# Patient Record
Sex: Male | Born: 1950 | Race: White | Hispanic: No | Marital: Married | Smoking: Former smoker
Health system: Southern US, Community
[De-identification: ages and names within clinical notes are randomized; demographics above are authoritative.]

## PROBLEM LIST (undated history)

## (undated) DIAGNOSIS — M545 Low back pain, unspecified: Secondary | ICD-10-CM

## (undated) DIAGNOSIS — R519 Headache, unspecified: Secondary | ICD-10-CM

## (undated) DIAGNOSIS — E785 Hyperlipidemia, unspecified: Secondary | ICD-10-CM

## (undated) DIAGNOSIS — N419 Inflammatory disease of prostate, unspecified: Secondary | ICD-10-CM

## (undated) DIAGNOSIS — D649 Anemia, unspecified: Secondary | ICD-10-CM

## (undated) DIAGNOSIS — K5790 Diverticulosis of intestine, part unspecified, without perforation or abscess without bleeding: Secondary | ICD-10-CM

## (undated) HISTORY — PX: FOOT SURGERY: SHX648

## (undated) HISTORY — DX: Low back pain: M54.5

## (undated) HISTORY — DX: Low back pain, unspecified: M54.50

## (undated) HISTORY — DX: Inflammatory disease of prostate, unspecified: N41.9

## (undated) HISTORY — DX: Diverticulosis of intestine, part unspecified, without perforation or abscess without bleeding: K57.90

---
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
## 1994-05-22 HISTORY — PX: COLECTOMY: SHX59

## 2006-06-08 ENCOUNTER — Ambulatory Visit: Payer: Self-pay | Admitting: Unknown Physician Specialty

## 2007-07-05 ENCOUNTER — Ambulatory Visit: Payer: Self-pay | Admitting: Internal Medicine

## 2008-06-05 ENCOUNTER — Ambulatory Visit: Payer: Self-pay | Admitting: Internal Medicine

## 2008-09-08 ENCOUNTER — Ambulatory Visit: Payer: Self-pay | Admitting: Unknown Physician Specialty

## 2010-05-22 ENCOUNTER — Ambulatory Visit: Payer: Self-pay | Admitting: Internal Medicine

## 2010-05-23 ENCOUNTER — Emergency Department: Payer: Self-pay | Admitting: Emergency Medicine

## 2011-09-14 ENCOUNTER — Emergency Department: Payer: Self-pay | Admitting: Internal Medicine

## 2011-09-14 LAB — COMPREHENSIVE METABOLIC PANEL
Albumin: 4.2 g/dL (ref 3.4–5.0)
Alkaline Phosphatase: 103 U/L (ref 50–136)
BUN: 12 mg/dL (ref 7–18)
Bilirubin,Total: 0.5 mg/dL (ref 0.2–1.0)
Calcium, Total: 8.8 mg/dL (ref 8.5–10.1)
Chloride: 105 mmol/L (ref 98–107)
Creatinine: 0.81 mg/dL (ref 0.60–1.30)
EGFR (African American): >60
Glucose: 95 mg/dL (ref 65–99)
Osmolality: 277 (ref 275–301)
SGPT (ALT): 29 U/L
Sodium: 139 mmol/L (ref 136–145)
Total Protein: 7.4 g/dL (ref 6.4–8.2)

## 2011-09-14 LAB — CBC
HGB: 14.2 g/dL (ref 13.0–18.0)
MCH: 31.8 pg (ref 26.0–34.0)
MCHC: 33.2 g/dL (ref 32.0–36.0)
Platelet: 231 10*3/uL (ref 150–440)
RBC: 4.47 10*6/uL (ref 4.40–5.90)

## 2011-09-14 LAB — TROPONIN I: Troponin-I: <0.02 ng/mL

## 2013-07-20 HISTORY — PX: COLONOSCOPY: SHX174

## 2013-10-24 ENCOUNTER — Ambulatory Visit: Payer: Self-pay

## 2013-10-27 ENCOUNTER — Encounter: Payer: Self-pay | Admitting: General Surgery

## 2013-10-27 ENCOUNTER — Ambulatory Visit (INDEPENDENT_AMBULATORY_CARE_PROVIDER_SITE_OTHER): Payer: 59 | Admitting: General Surgery

## 2013-10-27 ENCOUNTER — Other Ambulatory Visit: Payer: Self-pay | Admitting: General Surgery

## 2013-10-27 VITALS — BP 130/72 | HR 70 | Resp 20 | Ht 70.0 in | Wt 174.0 lb

## 2013-10-27 DIAGNOSIS — K4090 Unilateral inguinal hernia, without obstruction or gangrene, not specified as recurrent: Secondary | ICD-10-CM | POA: Insufficient documentation

## 2013-10-27 NOTE — Patient Instructions (Addendum)
Patient to be scheduled for a left inguinal hernia repair. The patient advised he can use Aleve or Advil as needed for pain. The patient is aware to call back for any questions or concerns.  Open Hernia Repair Open hernia repair is surgery to fix a hernia. A hernia occurs when an internal organ or tissue pushes out through a weak spot in the abdominal wall muscles. Hernias commonly occur in the groin and around the navel. Most hernias tend to get worse over time. Surgery is often done to prevent the hernia from getting bigger, becoming uncomfortable, or becoming an emergency. Emergency surgery may be needed if abdominal contents get stuck in the opening (incarcerated hernia) or the blood supply gets cut off (strangulated hernia). In an open repair, a large cut (incision) is made in the abdomen to perform the surgery. LET [facility] CARE PROVIDER KNOW ABOUT:  Any allergies you have.  All medicines you are taking, including vitamins, herbs, eye drops, creams, and over-the-counter medicines.  Previous problems you or members of your family have had with the use of anesthetics.  Any blood disorders you have.  Previous surgeries you have had.  Medical conditions you have. RISKS AND COMPLICATIONS Generally, this is a safe procedure. However, as with any procedure, complications can occur. Possible complications include:  Infection.  Bleeding.  Nerve injury.  Chronic pain.  The hernia can come back.  Injury to the intestines. BEFORE THE PROCEDURE  Ask your health care provider about changing or stopping any regular medicines. Avoid taking aspirin or blood thinners as directed by your health care provider.  Do noteat or drink anything after midnight the night before surgery.  If you smoke, do not smoke for at least 2 weeks before your surgery.  Do not drink alcohol the day before your surgery.  Let your health care provider know if you develop a cold or any infection before your  surgery.  Arrange for someone to drive you home after the procedure or after your hospital stay. Also arrange for someone to help you with activities during recovery. PROCEDURE   Small monitors will be put on your body. They are used to check your heart, blood pressure, and oxygen level.   An IV access tube will be put into one of your veins. Medicine will be able to flow directly into your body through this IV tube.   You might be given a medicine to help you relax (sedative).   You will be given a medicine to make you sleep (general anesthetic). A breathing tube may be placed into your lungs during the procedure.  A cut (incision) is made over the hernia defect, and the contents are pushed back into the abdomen.  If the hernia is small, stitches may be used to bring the muscle edges back together.  Typically, a surgeon will place a mesh patch made of man-made material (synthetic) to cover the defect. The mesh is sewn to healthy muscle. This reduces the risk of the hernia coming back.  The tissue and skin over the hernia are then closed with stitches or staples.  If the hernia was large, a drain may be left in place to collect excess fluid where the hernia used to be.  Bandages (dressings) are used to cover the incision. AFTER THE PROCEDURE  You will be taken to a recovery area where your progress will be monitored.  If the hernia was small or in the groin (inguinal) region, you will likely be allowed to  go home once you are awake, stable, and taking fluids well.  If the hernia was large, you may have to wait for your bowel function to return. You may need to stay in the hospital for 2 3 days until you can eat and your pain is controlled. A drain may be left in place for 5 7 days. You will be taught how to care for the drain. Document Released: 11/01/2000 Document Revised: 02/26/2013 Document Reviewed: 12/18/2012 [employer] Patient Information 2014 [place], [place].  Patient is  [facility]/13. He will pre admit by phone on 10/31/13. Patient is aware of date instructions.

## 2013-10-27 NOTE — Progress Notes (Signed)
aPatient ID: Russell Snow, male   DOB: 1950/08/12, 63 y.o.   MRN: [id number]  Chief Complaint  Patient presents with  . Other    LLQ pain     HPI DRAGON THRUSH is a 63 y.o. male here today for an evaluation of lower left quadrant pain. Patient states this has been going on for about 3 weeks . He had Ct scan done at [facility] on 10/24/13. He denies any nausea or vomiting. He just discontinued a course of antibiotics on 10/23/13. The pain is described as a dull ache that has gradually gone to a hot searing stabbing pain in the left groin. The more severe pain started approximately 1 week after the pain started. The pain comes and goes. He noticed the pain mostly when sitting and bending over. The pain can last up to 1.5 hours. The pain radiates to the groin. No problems with the bowels at this time. The patient had previously undergone a partial colectomy for recurrent diverticulitis in 1996.  The patient had previously been identified with gallstones dating back to at least 2012. He denies any dietary intolerance or right upper quadrant pain.   HPI  Past Medical History  Diagnosis Date  . Diverticulosis   . Lumbago   . Prostatitis     Past Surgical History  Procedure Laterality Date  . Tonsillectomy  1957  . Colectomy  1996  . Foot surgery  1998?    tumor removed  . Colonoscopy  07/2013    Dr. Vira Agar    History reviewed. No pertinent family history.  Social History History  Substance Use Topics  . Smoking status: Former Smoker -- 1.00 packs/day for 8 years    Types: Cigarettes  . Smokeless tobacco: Never Used  . Alcohol Use: Yes    No Known Allergies  Current Outpatient Prescriptions  Medication Sig Dispense Refill  . finasteride (PROSCAR) 5 MG tablet Take 1 tablet by mouth daily.      Marland Kitchen MAGNESIUM PO Take 1 tablet by mouth daily.      Marland Kitchen omeprazole (PRILOSEC) 20 MG capsule Take 1 capsule by mouth daily.       No current facility-administered medications for this visit.     Review of Systems Review of Systems  Constitutional: Negative.   Respiratory: Negative.   Cardiovascular: Negative.   Gastrointestinal: Positive for abdominal pain. Negative for nausea, vomiting, diarrhea, constipation, blood in stool, abdominal distention, anal bleeding and rectal pain.    Blood pressure 130/72, pulse 70, resp. rate 20, height _0  (1.778 m), weight 174 lb (78.926 kg).  Physical Exam Physical Exam  Constitutional: He is oriented to person, place, and time. He appears well-developed and well-nourished.  Neck: Neck supple. No thyromegaly present.  Cardiovascular: Normal rate, regular rhythm, normal heart sounds and normal pulses.   No murmur heard. Pulmonary/Chest: Effort normal and breath sounds normal.  Abdominal: Soft. Normal appearance and bowel sounds are normal. A hernia is present. Hernia confirmed positive in the left inguinal area. Hernia confirmed negative in the right inguinal area.  Genitourinary:     Lymphadenopathy:    He has no cervical adenopathy.  Neurological: He is alert and oriented to person, place, and time.  Skin: Skin is warm and dry.    Data Reviewed CT scan dated October 24, 2013 was reviewed. Diverticulosis in the descending colon without evidence of inflammation is identified. There is an area of nondistended colon in the sigmoid. Radiologist read concerned about the possibility of  an intraluminal lesion. (Patient reports colonoscopy this calendar year). Cholelithiasis. Left posterior diaphragmatic hernia. These fills were reviewed. Compared to previous study completed in 2012. Stable posterior diaphragmatic hernia without intestinal involvement. Gallstones noted 2012.  PCP notes from [facility] reviewed.  Laboratory studies stated October 23, 2013 showed a hemoglobin of 14.8 with an MCV of 92, white blood cell count 6600. Normal differential. Comprehensive metabolic panel entirely normal. Creatinine 0.9. EGFR 85.   Assessment     Symptomatic inguinal hernia.  Asymptomatic cholelithiasis.  Asymptomatic posterior diaphragmatic hernia.  No evidence of active diverticulitis.     Plan    The patient's report of dull pain progressing to a burning sensation is consistent with irritation of the iliohypogastric/ilioinguinal nerves. Clinical exam shows no evidence of obstruction. Clinical history does not suggestive obstructive symptoms. The CT does not clearly delineate bowel in the area although there is a prominent fat pad on the left inguinal canal.  Indications for elective repair with the use of prosthetic mesh were reviewed. Risks associated with surgery were discussed including those of bleeding, infection chronic pain.  Arrangements will be made to repair this at the next available time.     Patient is scheduled for [facility]. He will pre admit by phone on 10/31/13. Patient is aware of date instructions.  PCP: Lorn Junes Jen Benedict 10/27/2013, 8:48 PM

## 2013-11-11 ENCOUNTER — Ambulatory Visit: Payer: Self-pay | Admitting: General Surgery

## 2013-11-11 DIAGNOSIS — K4090 Unilateral inguinal hernia, without obstruction or gangrene, not specified as recurrent: Secondary | ICD-10-CM

## 2013-11-11 HISTORY — PX: HERNIA REPAIR: SHX51

## 2013-11-12 ENCOUNTER — Encounter: Payer: Self-pay | Admitting: General Surgery

## 2013-11-13 ENCOUNTER — Telehealth: Payer: Self-pay | Admitting: *Deleted

## 2013-11-13 NOTE — Telephone Encounter (Signed)
Phone call from patient states he has noticed some swelling/brusing of private area. No trouble voiding noted. Informed to monitor use heat/ice and wear support briefs/jockey strap. Call for worsening symptoms, pt agrees.

## 2013-11-20 ENCOUNTER — Encounter: Payer: Self-pay | Admitting: General Surgery

## 2013-11-20 ENCOUNTER — Ambulatory Visit (INDEPENDENT_AMBULATORY_CARE_PROVIDER_SITE_OTHER): Payer: Self-pay | Admitting: General Surgery

## 2013-11-20 VITALS — BP 130/78 | HR 78 | Resp 14 | Ht 70.0 in | Wt 178.0 lb

## 2013-11-20 DIAGNOSIS — K469 Unspecified abdominal hernia without obstruction or gangrene: Secondary | ICD-10-CM | POA: Insufficient documentation

## 2013-11-20 DIAGNOSIS — K4090 Unilateral inguinal hernia, without obstruction or gangrene, not specified as recurrent: Secondary | ICD-10-CM

## 2013-11-20 NOTE — Progress Notes (Signed)
Patient ID: Russell Snow, male   DOB: 1950/09/05, 63 y.o.   MRN: [id number]  Chief Complaint  Patient presents with  . Routine Post Op    HPI Russell MooreDanny C Schuitema is a 63 y.o. male.  Here today for postoperative visit for repair of left inguinal hernia preformed on 11/11/13. He reports the pain is very manageable and has stopped taking pain medications. He denies any other problems and is doing very well. He reports the discoloration and swelling in his genitals is mostly gone.  HPI  Past Medical History  Diagnosis Date  . Diverticulosis   . Lumbago   . Prostatitis     Past Surgical History  Procedure Laterality Date  . Tonsillectomy  1957  . Colectomy  1996  . Foot surgery  1998?    tumor removed  . Colonoscopy  07/2013    Dr. Mechele CollinElliott  . Hernia repair Left 11-11-13    inguinal    No family history on file.  Social History History  Substance Use Topics  . Smoking status: Former Smoker -- 1.00 packs/day for 8 years    Types: Cigarettes  . Smokeless tobacco: Never Used  . Alcohol Use: Yes    No Known Allergies  Current Outpatient Prescriptions  Medication Sig Dispense Refill  . finasteride (PROSCAR) 5 MG tablet Take 1 tablet by mouth daily.      Marland Kitchen. MAGNESIUM PO Take 1 tablet by mouth daily.      Marland Kitchen. omeprazole (PRILOSEC) 20 MG capsule Take 1 capsule by mouth daily.       No current facility-administered medications for this visit.    Review of Systems Review of Systems  Constitutional: Negative.   Respiratory: Negative.   Cardiovascular: Negative.     Blood pressure 130/78, pulse 78, resp. rate 14, height 5\' 10"  (1.778 m), weight 178 lb (80.74 kg).  Physical Exam Physical Exam  Constitutional: He is oriented to person, place, and time. He appears well-developed and well-nourished.  Genitourinary: Testes normal.  Mild swelling at surgical site.  Neurological: He is alert and oriented to person, place, and time.    Assessment    Doing well status post left  inguinal hernia repair.     Plan    The patient was encouraged to make use of local heat to help resolve the swelling more quickly. He may increase his activity as tolerated. Will plan for a followup examination in one month.      PCP;Clydie Braunavid Fitzgerald, MD   Earline MayotteByrnett, Jeffrey W 11/20/2013, 9:31 PM

## 2013-12-22 ENCOUNTER — Encounter: Payer: Self-pay | Admitting: General Surgery

## 2013-12-22 ENCOUNTER — Ambulatory Visit (INDEPENDENT_AMBULATORY_CARE_PROVIDER_SITE_OTHER): Payer: Self-pay | Admitting: General Surgery

## 2013-12-22 VITALS — BP 122/70 | HR 70 | Resp 12 | Ht 70.0 in | Wt 179.0 lb

## 2013-12-22 DIAGNOSIS — K4090 Unilateral inguinal hernia, without obstruction or gangrene, not specified as recurrent: Secondary | ICD-10-CM

## 2013-12-22 NOTE — Progress Notes (Signed)
Patient ID: Russell Snow, male   DOB: [birth year], 63 y.o.   MRN: [id number]  Chief Complaint  Patient presents with  . Routine Post Op    left inguinal hernia    HPI Russell Snow is a 63 y.o. male Here today for postoperative visit for repair of left inguinal hernia preformed on 11/11/13. He reports the pain is very manageable and has stopped taking pain medications. He has been aware of a small irritated area at the lateral aspect of the incision. HPI  Past Medical History  Diagnosis Date  . Diverticulosis   . Lumbago   . Prostatitis     Past Surgical History  Procedure Laterality Date  . Tonsillectomy  1957  . Colectomy  1996  . Foot surgery  1998?    tumor removed  . Colonoscopy  07/2013    Dr. Mechele CollinElliott  . Hernia repair Left 11-11-13    inguinal    No family history on file.  Social History History  Substance Use Topics  . Smoking status: Former Smoker -- 1.00 packs/day for 8 years    Types: Cigarettes  . Smokeless tobacco: Never Used  . Alcohol Use: Yes    No Known Allergies  Current Outpatient Prescriptions  Medication Sig Dispense Refill  . finasteride (PROSCAR) 5 MG tablet Take 1 tablet by mouth daily.      Marland Kitchen. omeprazole (PRILOSEC) 20 MG capsule Take 1 capsule by mouth daily.       No current facility-administered medications for this visit.    Review of Systems Review of Systems  Constitutional: Negative.   Respiratory: Negative.   Cardiovascular: Negative.     Blood pressure 122/70, pulse 70, resp. rate 12, height 5\' 10"  (1.778 m), weight 179 lb (81.194 kg).  Physical Exam Physical Exam  Constitutional: He is oriented to person, place, and time. He appears well-developed and well-nourished.  Abdominal:    Neurological: He is alert and oriented to person, place, and time.  Skin: Skin is warm and dry.       Assessment    Doing well status post left inguinal hernia repair.    Plan    The patient released to full activity. He'll call  if he has any concerns.    PCP: Russell Snow, Russell Snow    Russell Snow 12/23/2013, 6:56 PM

## 2013-12-22 NOTE — Patient Instructions (Signed)
Patient to return as needed. Proper lifting techniques reviewed. 

## 2014-09-12 NOTE — Op Note (Signed)
PATIENT NAME:  Russell Snow, Russell Snow MR#:  [id number] DATE OF BIRTH:  10/16/1950  DATE OF PROCEDURE:  11/11/2013  PREOPERATIVE DIAGNOSIS: Left inguinal hernia.   POSTOPERATIVE DIAGNOSIS: Left inguinal hernia, indirect.   OPERATIVE PROCEDURE: Left inguinal hernia repair with Bard plug and patch, polypropylene mesh.   SURGEON: Donnalee CurryJeffrey Byrnett, MD    ANESTHESIA: General by LMA, Marcaine 0.5% with 1:200,000 units of epinephrine, 30 mL local infiltration, Toradol 30 mg.   ESTIMATED BLOOD LOSS: Minimal.   CLINICAL NOTE: This 64 year old male has developed symptomatic inguinal hernia and was admitted for elective repair. He received Kefzol prior to the procedure.   Hair was removed with clippers in the holding area.   OPERATIVE NOTE: With the patient under adequate general anesthesia, the area was prepped with ChloraPrep and draped. Marcaine was infiltrated for postoperative analgesia. A 5 cm skin line incision along the anticipated course of the inguinal canal was carried down through the skin and subcutaneous tissue, with hemostasis achieved by electrocautery and 3-0 Vicryl ties. The external oblique was opened in the direction of its fibers. The cord and hernia sac were mobilized and encircled with a Penrose drain. Multiple lipomas of the cord were dissected back to the internal ring, ligated with 3-0 Vicryl, excised and discarded.   The hernia sac was dissected free and was noted to have marked scarring near the internal ring. This was reduced into the preperitoneal space, and a medium Bard polypropylene plug placed. This was anchored to the iliopubic tract with 0 Surgilon figure-of-eight suture. The onlay mesh was placed along the floor of the inguinal canal and anchored to the pubic tubercle with 0 Surgilon. The inferior aspect was anchored to the inguinal ligament with interrupted 0 Surgilon sutures, and the medial and superior edges and anchored to the transverse abdominis aponeurosis with similar  sutures. A lateral slit for cord passage was closed to minimize the chance for recurrent herniation.   The ilioinguinal, iliohypogastric, and genitofemoral nerves were identified and protected during the dissection. The external oblique was closed in the direction of its fibers, and Toradol placed in the wound for postoperative analgesia. The external oblique was closed with running 2-0 Vicryl, Scarpa's fascia was closed with running 3-0 Vicryl, and the skin closed with running 4-0 Vicryl subcuticular suture. Benzoin, Steri-Strips, Telfa and Tegaderm dressing was then applied. The patient tolerated the procedure well and was taken to the recovery room in stable condition.    ____________________________ Earline MayotteJeffrey W. Byrnett, MD jwb:cg D: 11/11/2013 22:43:02 ET T: 11/12/2013 02:21:45 ET JOB#: [id number]  cc: Earline MayotteJeffrey W. Byrnett, MD, <Dictator> Stann Mainlandavid P. Sampson GoonFitzgerald, MD JEFFREY Brion AlimentW BYRNETT MD ELECTRONICALLY SIGNED 11/12/2013 12:48

## 2015-11-15 ENCOUNTER — Other Ambulatory Visit: Payer: Self-pay | Admitting: Student

## 2015-11-15 DIAGNOSIS — M1711 Unilateral primary osteoarthritis, right knee: Secondary | ICD-10-CM

## 2015-11-15 DIAGNOSIS — R2241 Localized swelling, mass and lump, right lower limb: Secondary | ICD-10-CM

## 2015-12-03 ENCOUNTER — Ambulatory Visit
Admission: RE | Admit: 2015-12-03 | Discharge: 2015-12-03 | Disposition: A | Discharge status: Home / Self Care | Payer: Medicare Other | Source: Ambulatory Visit | Attending: Student | Admitting: Student

## 2015-12-03 DIAGNOSIS — M25861 Other specified joint disorders, right knee: Secondary | ICD-10-CM | POA: Diagnosis present

## 2015-12-03 DIAGNOSIS — S83231A Complex tear of medial meniscus, current injury, right knee, initial encounter: Secondary | ICD-10-CM | POA: Insufficient documentation

## 2015-12-03 DIAGNOSIS — M25461 Effusion, right knee: Secondary | ICD-10-CM | POA: Diagnosis not present

## 2015-12-03 DIAGNOSIS — R2241 Localized swelling, mass and lump, right lower limb: Secondary | ICD-10-CM

## 2015-12-03 DIAGNOSIS — S83271A Complex tear of lateral meniscus, current injury, right knee, initial encounter: Secondary | ICD-10-CM | POA: Insufficient documentation

## 2015-12-03 DIAGNOSIS — M1711 Unilateral primary osteoarthritis, right knee: Secondary | ICD-10-CM | POA: Insufficient documentation

## 2019-06-14 ENCOUNTER — Other Ambulatory Visit: Payer: Self-pay

## 2019-06-14 ENCOUNTER — Emergency Department
Admission: EM | Admit: 2019-06-14 | Discharge: 2019-06-14 | Disposition: A | Discharge status: Home / Self Care | Payer: Medicare Other | Attending: Student | Admitting: Student

## 2019-06-14 ENCOUNTER — Emergency Department: Payer: Medicare Other

## 2019-06-14 DIAGNOSIS — X501XXA Overexertion from prolonged static or awkward postures, initial encounter: Secondary | ICD-10-CM | POA: Insufficient documentation

## 2019-06-14 DIAGNOSIS — Z87891 Personal history of nicotine dependence: Secondary | ICD-10-CM | POA: Insufficient documentation

## 2019-06-14 DIAGNOSIS — S93601A Unspecified sprain of right foot, initial encounter: Secondary | ICD-10-CM | POA: Diagnosis not present

## 2019-06-14 DIAGNOSIS — Y929 Unspecified place or not applicable: Secondary | ICD-10-CM | POA: Diagnosis not present

## 2019-06-14 DIAGNOSIS — Y999 Unspecified external cause status: Secondary | ICD-10-CM | POA: Diagnosis not present

## 2019-06-14 DIAGNOSIS — S99921A Unspecified injury of right foot, initial encounter: Secondary | ICD-10-CM | POA: Diagnosis present

## 2019-06-14 DIAGNOSIS — Y939 Activity, unspecified: Secondary | ICD-10-CM | POA: Diagnosis not present

## 2019-06-14 MED ORDER — LIDOCAINE 5 % EX PTCH
1.0000 | MEDICATED_PATCH | CUTANEOUS | Status: DC
  Administered 2019-06-14: 1 via TRANSDERMAL
  Filled 2019-06-14: qty 1

## 2019-06-14 MED ORDER — LIDOCAINE 5 % EX PTCH: 1.0000 | MEDICATED_PATCH | Freq: Two times a day (BID) | CUTANEOUS | 0 refills | Status: AC

## 2019-06-14 NOTE — ED Triage Notes (Signed)
Pt states that at noon today he stepped out of his building and twisted right foot - pt reports hearing a "crack" and since is having severe pain on the outer aspect of right foot - pt reports difficulty with bearing weight

## 2019-06-14 NOTE — ED Notes (Addendum)
Pt c/o right lateral foot pain x1 week and states that this morning he stepped down and felt a "crack" in his right foot.

## 2019-06-14 NOTE — Discharge Instructions (Addendum)
Discharge care instruction apply Lidoderm patches as needed every 12 hours for pain.

## 2019-06-14 NOTE — ED Provider Notes (Signed)
[facility] Emergency Department Provider Note   ____________________________________________   First MD Initiated Contact with Patient 06/14/19 1441     (approximate)  I have reviewed the triage vital signs and the nursing notes.   HISTORY  Chief Complaint Foot Pain    HPI Russell Snow is a 69 y.o. male patient complain right foot pain secondary to a twisting movement while coming out of a to shed this  Afternoon.  Patient states he heard a "crack" and since then have pain with weightbearing.  Patient described the pain as "sharp".  The only palliative measures with nonweightbearing.         Past Medical History:  Diagnosis Date  . Diverticulosis   . Lumbago   . Prostatitis     Patient Active Problem List   Diagnosis Date Noted  . Hernia of abdominal cavity 11/20/2013  . Inguinal hernia 10/27/2013    Past Surgical History:  Procedure Laterality Date  . COLECTOMY  1996  . COLONOSCOPY  07/2013   Dr. Vira Agar  . FOOT SURGERY  1998?   tumor removed  . HERNIA REPAIR Left 11-11-13   inguinal  . TONSILLECTOMY  1957    Prior to Admission medications   Medication Sig Start Date End Date Taking? Authorizing Provider  finasteride (PROSCAR) 5 MG tablet Take 1 tablet by mouth daily.    [provider]  lidocaine (LIDODERM) 5 % Place 1 patch onto the skin every 12 (twelve) hours. Remove & Discard patch within 12 hours or as directed by MD 06/14/19 06/13/20  Sable Feil, PA-C  omeprazole (PRILOSEC) 20 MG capsule Take 1 capsule by mouth daily.    [provider]    Allergies Patient has no known allergies.  No family history on file.  Social History Social History   Tobacco Use  . Smoking status: Former Smoker    Packs/day: 1.00    Years: 8.00    Pack years: 8.00    Types: Cigarettes  . Smokeless tobacco: Never Used  Substance Use Topics  . Alcohol use: Yes    Alcohol/week: 1.0 standard drinks    Types: 1 Cans of  beer per week    Comment: daily  . Drug use: No    Review of Systems Constitutional: No fever/chills Eyes: No visual changes. ENT: No sore throat. Cardiovascular: Denies chest pain. Respiratory: Denies shortness of breath. Gastrointestinal: No abdominal pain.  No nausea, no vomiting.  No diarrhea.  No constipation. Genitourinary: Negative for dysuria. Musculoskeletal: Right foot pain. Skin: Negative for rash. Neurological: Negative for headaches, focal weakness or numbness.   ____________________________________________   PHYSICAL EXAM:  VITAL SIGNS: ED Triage Vitals  Enc Vitals Group     BP 06/14/19 1408 (!) 146/92     Pulse Rate 06/14/19 1408 84     Resp 06/14/19 1408 16     Temp 06/14/19 1408 99 F (37.2 C)     Temp Source 06/14/19 1408 Oral     SpO2 06/14/19 1408 97 %     Weight 06/14/19 1409 161 lb (73 kg)     Height 06/14/19 1409 5\' 8"  (1.727 m)     Head Circumference --      Peak Flow --      Pain Score 06/14/19 1409 0     Pain Loc --      Pain Edu? --      Excl. in [place]? --     Constitutional: Alert and oriented. Well  appearing and in no acute distress. Cardiovascular: Normal rate, regular rhythm. Grossly normal heart sounds.  Good peripheral circulation. Respiratory: Normal respiratory effort.  No retractions. Lungs CTAB. Musculoskeletal: No obvious deformity to the right foot.  Patient is moderate guarding palpation of the lateral aspect of the fourth and fifth metatarsal.   Neurologic:  Normal speech and language. No gross focal neurologic deficits are appreciated. No gait instability. Skin:  Skin is warm, dry and intact. No rash noted. Psychiatric: Mood and affect are normal. Speech and behavior are normal.  ____________________________________________   LABS (all labs ordered are listed, but only abnormal results are displayed)  Labs Reviewed - No data to display ____________________________________________  EKG    ____________________________________________  RADIOLOGY  ED MD interpretation:    Official radiology report(s): DG Foot Complete Right  Result Date: 06/14/2019 CLINICAL DATA:  Acute RIGHT foot pain after twisting incident today. EXAM: RIGHT FOOT COMPLETE - 3+ VIEW COMPARISON:  None. FINDINGS: No acute appearing fracture line or displaced fracture fragment identified. No acute or suspicious osseous lesion. Dystrophic chronic-appearing soft tissue calcification underlying the third metatarsal head, presumably sequela of previous injury or inflammation. No evidence of acute soft tissue abnormality. Mild degenerative spurring within the midfoot. No evidence of advanced degenerative change. IMPRESSION: No acute findings. Electronically Signed   By: Bary Richard M.D.   On: 06/14/2019 16:26    ____________________________________________   PROCEDURES  Procedure(s) performed (including Critical Care):  Procedures   ____________________________________________   INITIAL IMPRESSION / ASSESSMENT AND PLAN / ED COURSE  As part of my medical decision making, I reviewed the following data within the electronic MEDICAL RECORD NUMBER     Patient presents with right foot pain secondary to a twisting incident earlier today.  Discussed x-ray findings with patient showing no fracture dislocation.  Patient has moderate degenerative changes in the foot.  Lidoderm patch was applied to patient foot and he was Ace wrapped.  Patient given discharge care instructions advised follow-up PCP.    Russell Snow was evaluated in Emergency Department on 06/14/2019 for the symptoms described in the history of present illness. He was evaluated in the context of the global COVID-19 pandemic, which necessitated consideration that the patient might be at risk for infection with the SARS-CoV-2 virus that causes COVID-19. Institutional protocols and algorithms that pertain to the evaluation of patients at risk for COVID-19 are  in a state of rapid change based on information released by regulatory bodies including the CDC and federal and state organizations. These policies and algorithms were followed during the patient's care in the ED.       ____________________________________________   FINAL CLINICAL IMPRESSION(S) / ED DIAGNOSES  Final diagnoses:  Sprain of right foot, initial encounter     ED Discharge Orders         Ordered    lidocaine (LIDODERM) 5 %  Every 12 hours     06/14/19 1635           Note:  This document was prepared using Dragon voice recognition software and may include unintentional dictation errors.    Joni Reining, PA-C 06/14/19 1638    Miguel Aschoff., MD 06/15/19 Ebony Cargo

## 2020-02-04 ENCOUNTER — Other Ambulatory Visit: Payer: Self-pay

## 2020-02-04 ENCOUNTER — Other Ambulatory Visit
Admission: RE | Admit: 2020-02-04 | Discharge: 2020-02-04 | Disposition: A | Discharge status: Home / Self Care | Payer: Medicare Other | Source: Ambulatory Visit | Attending: General Surgery | Admitting: General Surgery

## 2020-02-04 DIAGNOSIS — Z20822 Contact with and (suspected) exposure to covid-19: Secondary | ICD-10-CM | POA: Insufficient documentation

## 2020-02-04 DIAGNOSIS — Z01812 Encounter for preprocedural laboratory examination: Secondary | ICD-10-CM | POA: Diagnosis present

## 2020-02-04 LAB — SARS CORONAVIRUS 2 (TAT 6-24 HRS): SARS Coronavirus 2: NEGATIVE

## 2020-02-05 ENCOUNTER — Encounter: Payer: Self-pay | Admitting: General Surgery

## 2020-02-06 ENCOUNTER — Encounter
Admission: RE | Disposition: A | Discharge status: Home / Self Care | Payer: Self-pay | Source: Home / Self Care | Attending: General Surgery

## 2020-02-06 ENCOUNTER — Other Ambulatory Visit: Payer: Self-pay

## 2020-02-06 ENCOUNTER — Ambulatory Visit
Admission: RE | Admit: 2020-02-06 | Discharge: 2020-02-06 | Disposition: A | Discharge status: Home / Self Care | Payer: Medicare Other | Attending: General Surgery | Admitting: General Surgery

## 2020-02-06 ENCOUNTER — Ambulatory Visit: Payer: Medicare Other | Admitting: Anesthesiology

## 2020-02-06 ENCOUNTER — Encounter: Payer: Self-pay | Admitting: General Surgery

## 2020-02-06 DIAGNOSIS — K5730 Diverticulosis of large intestine without perforation or abscess without bleeding: Secondary | ICD-10-CM | POA: Insufficient documentation

## 2020-02-06 DIAGNOSIS — Z9049 Acquired absence of other specified parts of digestive tract: Secondary | ICD-10-CM | POA: Insufficient documentation

## 2020-02-06 DIAGNOSIS — Z1211 Encounter for screening for malignant neoplasm of colon: Secondary | ICD-10-CM | POA: Insufficient documentation

## 2020-02-06 DIAGNOSIS — M353 Polymyalgia rheumatica: Secondary | ICD-10-CM | POA: Insufficient documentation

## 2020-02-06 DIAGNOSIS — K219 Gastro-esophageal reflux disease without esophagitis: Secondary | ICD-10-CM | POA: Insufficient documentation

## 2020-02-06 DIAGNOSIS — Z87891 Personal history of nicotine dependence: Secondary | ICD-10-CM | POA: Diagnosis not present

## 2020-02-06 DIAGNOSIS — Z8371 Family history of colonic polyps: Secondary | ICD-10-CM | POA: Insufficient documentation

## 2020-02-06 HISTORY — DX: Hyperlipidemia, unspecified: E78.5

## 2020-02-06 HISTORY — PX: COLONOSCOPY: SHX5424

## 2020-02-06 HISTORY — DX: Headache, unspecified: R51.9

## 2020-02-06 HISTORY — DX: Anemia, unspecified: D64.9

## 2020-02-06 SURGERY — COLONOSCOPY
Anesthesia: General

## 2020-02-06 MED ORDER — MIDAZOLAM HCL 5 MG/5ML IJ SOLN
INTRAMUSCULAR | Status: DC | PRN
  Administered 2020-02-06: 2 mg via INTRAVENOUS

## 2020-02-06 MED ORDER — LIDOCAINE HCL (PF) 2 % IJ SOLN
INTRAMUSCULAR | Status: DC | PRN
  Administered 2020-02-06: 60 mg

## 2020-02-06 MED ORDER — FENTANYL CITRATE (PF) 100 MCG/2ML IJ SOLN
INTRAMUSCULAR | Status: AC
  Filled 2020-02-06: qty 2

## 2020-02-06 MED ORDER — MIDAZOLAM HCL 2 MG/2ML IJ SOLN
INTRAMUSCULAR | Status: AC
  Filled 2020-02-06: qty 2

## 2020-02-06 MED ORDER — FENTANYL CITRATE (PF) 100 MCG/2ML IJ SOLN
INTRAMUSCULAR | Status: DC | PRN
  Administered 2020-02-06: 25 ug via INTRAVENOUS

## 2020-02-06 MED ORDER — PROPOFOL 10 MG/ML IV BOLUS
INTRAVENOUS | Status: DC | PRN
  Administered 2020-02-06: 20 mg via INTRAVENOUS

## 2020-02-06 MED ORDER — PROPOFOL 500 MG/50ML IV EMUL
INTRAVENOUS | Status: DC | PRN
  Administered 2020-02-06: 50 ug/kg/min via INTRAVENOUS

## 2020-02-06 MED ORDER — LIDOCAINE HCL (PF) 2 % IJ SOLN
INTRAMUSCULAR | Status: AC
  Filled 2020-02-06: qty 5

## 2020-02-06 MED ORDER — SODIUM CHLORIDE 0.9 % IV SOLN
INTRAVENOUS | Status: DC
  Administered 2020-02-06: 20 mL/h via INTRAVENOUS

## 2020-02-06 MED ORDER — PROPOFOL 500 MG/50ML IV EMUL
INTRAVENOUS | Status: AC
  Filled 2020-02-06: qty 50

## 2020-02-06 NOTE — Anesthesia Preprocedure Evaluation (Signed)
Anesthesia Evaluation  Patient identified by MRN, date of birth, ID band Patient awake    Reviewed: Allergy & Precautions, H&P , NPO status , Patient's Chart, lab work & pertinent test results  History of Anesthesia Complications Negative for: history of anesthetic complications  Airway Mallampati: III      Comment: TM 3 FB Dental  (+) Teeth Intact   Pulmonary neg sleep apnea, neg COPD, former smoker,    breath sounds clear to auscultation       Cardiovascular (-) angina(-) Past MI and (-) Cardiac Stents negative cardio ROS  (-) dysrhythmias  Rhythm:regular Rate:Normal     Neuro/Psych  Headaches, negative psych ROS   GI/Hepatic negative GI ROS, Neg liver ROS,   Endo/Other  negative endocrine ROS  Renal/GU negative Renal ROS  negative genitourinary   Musculoskeletal   Abdominal   Peds  Hematology negative hematology ROS (+)   Anesthesia Other Findings Past Medical History: No date: Anemia     Comment:  iron deficiency No date: Diverticulosis No date: Headache No date: Hyperlipidemia No date: Lumbago No date: Prostatitis  Past Surgical History: 1996: COLECTOMY 07/2013: COLONOSCOPY     Comment:  Dr. Mechele Collin 1998?: FOOT SURGERY     Comment:  tumor removed 11-11-13: HERNIA REPAIR; Left     Comment:  inguinal 1957: TONSILLECTOMY  BMI    Body Mass Index: 24.33 kg/m      Reproductive/Obstetrics negative OB ROS                             Anesthesia Physical Anesthesia Plan  ASA: II  Anesthesia Plan: General   Post-op Pain Management:    Induction:   PONV Risk Score and Plan: Propofol infusion and TIVA  Airway Management Planned: Nasal Cannula  Additional Equipment:   Intra-op Plan:   Post-operative Plan:   Informed Consent: I have reviewed the patients History and Physical, chart, labs and discussed the procedure including the risks, benefits and alternatives for  the proposed anesthesia with the patient or authorized representative who has indicated his/her understanding and acceptance.     Dental Advisory Given  Plan Discussed with: Anesthesiologist, CRNA and Surgeon  Anesthesia Plan Comments:         Anesthesia Quick Evaluation

## 2020-02-06 NOTE — Transfer of Care (Signed)
Immediate Anesthesia Transfer of Care Note  Patient: Russell Snow  Procedure(s) Performed: COLONOSCOPY (N/A )  Patient Location: PACU  Anesthesia Type:General  Level of Consciousness: sedated  Airway & Oxygen Therapy: Patient Spontanous Breathing and Patient connected to nasal cannula oxygen  Post-op Assessment: Report given to RN and Post -op Vital signs reviewed and stable  Post vital signs: Reviewed and stable  Last Vitals:  Vitals Value Taken Time  BP 95/59 02/06/20 1046  Temp 36.7 C 02/06/20 1046  Pulse 58 02/06/20 1047  Resp 17 02/06/20 1047  SpO2 100 % 02/06/20 1047  Vitals shown include unvalidated device data.  Last Pain:  Vitals:   02/06/20 1046  TempSrc: Temporal  PainSc:          Complications: No complications documented.

## 2020-02-06 NOTE — Anesthesia Postprocedure Evaluation (Signed)
Anesthesia Post Note  Patient: Russell Snow  Procedure(s) Performed: COLONOSCOPY (N/A )  Patient location during evaluation: PACU Anesthesia Type: General Level of consciousness: awake and alert Pain management: pain level controlled Vital Signs Assessment: post-procedure vital signs reviewed and stable Respiratory status: spontaneous breathing, nonlabored ventilation and respiratory function stable Cardiovascular status: blood pressure returned to baseline and stable Postop Assessment: no apparent nausea or vomiting Anesthetic complications: no   No complications documented.   Last Vitals:  Vitals:   02/06/20 1110 02/06/20 1120  BP: 103/68 105/63  Pulse: (!) 55 (!) 51  Resp: 18 17  Temp:    SpO2: 100% 98%    Last Pain:  Vitals:   02/06/20 1050  TempSrc: Temporal  PainSc:                  Karleen Hampshire

## 2020-02-06 NOTE — Op Note (Signed)
[facility] Patient Name: Russell Snow Procedure Date: 02/06/2020 10:24 AM MRN: [id number] Account #: [id number] Date of Birth: Aug 21, 1950 Admit Type: Outpatient Age: 69 Room: [facility] ENDO ROOM 1 Gender: Male Note Status: Finalized Procedure:             Colonoscopy Indications:           Family history of advanced adenoma of the colon in a                         first-degree relative before age 23 years Providers:             Earline Mayotte, MD Referring MD:          Clydie Braun (Referring MD) Medicines:             Monitored Anesthesia Care Complications:         No immediate complications. Procedure:             Pre-Anesthesia Assessment:                        - Prior to the procedure, a History and Physical was                         performed, and patient medications, allergies and                         sensitivities were reviewed. The patient's tolerance                         of previous anesthesia was reviewed.                        - The risks and benefits of the procedure and the                         sedation options and risks were discussed with the                         patient. All questions were answered and informed                         consent was obtained.                        After obtaining informed consent, the colonoscope was                         passed under direct vision. Throughout the procedure,                         the patient's blood pressure, pulse, and oxygen                         saturations were monitored continuously. The                         Colonoscope was introduced through the anus and                         advanced  to the the cecum, identified by appendiceal                         orifice and ileocecal valve. The colonoscopy was                         performed without difficulty. The patient tolerated                         the procedure well. The quality of the bowel                          preparation was excellent. Findings:      Multiple large-mouthed diverticula were found in the sigmoid colon,       descending colon, transverse colon, ascending colon and cecum.      The retroflexed view of the distal rectum and anal verge was normal and       showed no anal or rectal abnormalities. Impression:            - Diverticulosis in the sigmoid colon, in the                         descending colon, in the transverse colon, in the                         ascending colon and in the cecum.                        - The distal rectum and anal verge are normal on                         retroflexion view.                        - No specimens collected. Recommendation:        - Repeat colonoscopy in 5 years for surveillance. Procedure Code(s):     --- Professional ---                        [id number], Colonoscopy, flexible; diagnostic, including                         collection of specimen(s) by brushing or washing, when                         performed (separate procedure) Diagnosis Code(s):     --- Professional ---                        Z83.71, Family history of colonic polyps                        K57.30, Diverticulosis of large intestine without                         perforation or abscess without bleeding CPT copyright 2019 American Medical Association. All rights reserved. The codes documented in this report are preliminary and upon coder review may  be revised to meet current compliance requirements. Earline Mayotte, MD 02/06/2020 10:43:56 AM  This report has been signed electronically. Number of Addenda: 0 Note Initiated On: 02/06/2020 10:24 AM Scope Withdrawal Time: 0 hours 6 minutes 7 seconds  Total Procedure Duration: 0 hours 9 minutes 40 seconds       [facility]

## 2020-02-06 NOTE — H&P (Signed)
See history below. No interval change.  Family history colon polyps, for screening exam.  Lungs: Clear.  Cardio: RR.  HEENT: NEG.   Tolerated prep well.   Plan: Colonoscopy.        Russell Snow [place], [place] - 11/13/2019 9:00 AM EDT Formatting of this note is different from the original. [facility] - Colon Cancer Prevention Visit   Patient name: Russell Snow Date of Birth: 21-Jun-1950  No chief complaint on file. Date of Service: 11/13/2019 PCP: Velna Ochs, MD Referring Provider: Kathline Magic, MD   GI Patient Profile   [name] with the following known gastrointestinal history:  Specialty Problems   Gastroenterology Problems  GERD without esophagitis     Clinical data: -- 08/26/13: CSY (indic: FHx CP - mother/brother) - Sigmoid and descending diverticulosis. Small internal hemorrhoids. Repeat in 08/2018.  GI medications: -- Current: None. -- Prior: Prilosec (no longer needed after losing 25 lbs).  Interval History   [name] presents to schedule a colonoscopy for the indication of:  1. Family history of polyps in the colon   Last colonoscopy and family history are summarized above. He notes his bowel movements have been slightly looser over the last several months, but not more frequen and not watery. He has been eating more oatmeal and cream of wheat over the last several months, and wonders if this may have contributed. No abdominal pain, watery stools, nocturnal stools, steatorrhea, mucous in stool, alternating constipation, unexplained weight loss, or anorexia. Also no upper GI concerns like dysphagia or GERD symptoms.  In preparation for procedure(s), patient's past medical history and problem list were reviewed as below. Specifically, no history of MI, CVA, DM, renal issues, arthroplasty, cardiac surgery, sleep apnea, and problems with sedation.  Medical History   Problem  List: Patient Active Problem List  Diagnosis   Lumbago   Migraine headache   History of diverticulosis   History of prostatitis   Seasonal allergies   Benign non-nodular prostatic hyperplasia without lower urinary tract symptoms   GERD without esophagitis   Primary osteoarthritis of right knee   Synovial cyst of knee, right   Degenerative tear of meniscus, right   Rheumatoid factor positive   Polymyalgia rheumatica syndrome (CMS-HCC)   Screening for osteoporosis   Arthralgia    Medications: Outpatient Encounter Medications as of 11/13/2019  Medication Sig Dispense Refill   cholecalciferol (VITAMIN D3) 1000 unit capsule Take 1,000 Units by mouth once daily   No facility-administered encounter medications on file as of 11/13/2019.   Allergies: No Known Allergies   Past Medical History: Past Medical History:  Diagnosis Date   Anemia  iron deficiency   Chicken pox   History of diverticulosis   History of prostatitis   Hyperlipidemia   Lumbago   Migraine headache   Seasonal allergies  chronic    Past Surgical History: Past Surgical History:  Procedure Laterality Date   COLONOSCOPY 12/31/2002, 09/08/2008  FHCP (Mother/Brother)   COLONOSCOPY 08/26/2013  FHCP (Mother/Brother) CBF 08/2018: Recall ltr mailed   EGD 06/08/2006   excision morton's neuroma   Partial colectomy secondary to diverticulosis 1996   TONSILLECTOMY    Family History: Family History  Problem Relation Age of Onset   COPD Mother   Myocardial Infarction (Heart attack) Mother   Arthritis Mother   Colon polyps Mother   Diabetes type II Father   High blood pressure (Hypertension) Sister   Diverticulosis Brother   Colon  polyps Brother   Fibromyalgia Sister    Social History: Social History   Socioeconomic History   Marital status: Married  Spouse name: Not on file   Number of children: Not on file   Years of education: Not on file   Highest  education level: Not on file  Occupational History   Occupation: Retired  Tobacco Use   Smoking status: Former Smoker  Quit date: 05/22/1976  Years since quitting: 43.5   Smokeless tobacco: Never Used  Vaping Use   Vaping Use: Never used  Substance and Sexual Activity   Alcohol use: Yes  Alcohol/week: 0.0 standard drinks  Comment: occasionally   Drug use: No   Sexual activity: Yes  Partners: Female  Other Topics Concern   Not on file  Social History Narrative   Not on file   Social Determinants of Health   Financial Resource Strain:   Difficulty of Paying Living Expenses:  Food Insecurity:   Worried About Charity fundraiser in the Last Year:   Arboriculturist in the Last Year:  Transportation Needs:   Film/video editor (Medical):   Lack of Transportation (Non-Medical):  Physical Activity:   Days of Exercise per Week:   Minutes of Exercise per Session:  Stress:   Feeling of Stress :  Social Connections:   Frequency of Communication with Friends and Family:   Frequency of Social Gatherings with Friends and Family:   Attends Religious Services:   Active Member of Clubs or Organizations:   Attends Music therapist:   Marital Status:    General Review of Systems   Negative unless otherwise indicated: General: fever; chills; weight gain; weight loss; fatigue; weakness; depression; anxiety. Head: migraines; headaches. Eyes: jaundice; dryness; vision changes. Nose: bleeding. Mouth/Throat: oral ulcers; lymphadenopathy; dry mouth; sore throat; hoarseness. Endocrine: heat intolerance; cold intolerance. Respiratory: cough; wheezing; shortness of breath. Cardiovascular: chest pain; palpitations. GI: see HPI Musculoskeletal: joint swelling; muscle/joint pain. Neurological: syncope; seizures; dizziness; numbness/tingling. Skin: rashes; itching. Hematological and Lymphatic: easy bruising; easy bleeding.  Physical Examination    There were no vitals filed for this visit. There is no height or weight on file to calculate BMI.   Wt Readings from Last 3 Encounters:  09/17/19 72.1 kg (159 lb)  09/04/19 73.1 kg (161 lb 3.2 oz)  07/21/19 73 kg (161 lb)   General Appearance: WDWN male in no distress, sitting comfortably on exam room bench  Head: Atraumatic. Normocephalic.  Eyes: Anicteric.  Neck: Symmetrical. No lymphadenopathy.  Mouth: Lips, mucosa, and tongue normal.  Lungs: Clear to auscultation bilaterally. No wheezes/crackles/rales.  Heart: Regular rate and rhythm. S1 and S2 normal. No murmur, rub or gallop.  Abdomen:  Rectal: Soft, non-distended. + bowel sounds in all four quadrants. Non-tender. No rebound/guarding. No masses. No organomegaly. Deferred.  Extremities: No edema. No changes of vascular disease.  Skin: No abdominal rashes or lesions noted.  Neurologic: Alert and oriented x 3. No focal neurologic deficits appreciated.   Review of Data   I have personally reviewed and interpreted the following data today, in addition to data in GI Patient Profile.  Provider notes: Velna Ochs, MD (PCP) Kathline Magic, MD (Referring)  Labs: Appointment on 08/28/2019  Component Date Value   Glucose 08/28/2019 99   Sodium 08/28/2019 140   Potassium 08/28/2019 4.1   Chloride 08/28/2019 107   Carbon Dioxide (CO2) 08/28/2019 28.4   Urea Nitrogen (BUN) 08/28/2019 16   Creatinine 08/28/2019 0.9  Glomerular Filtration Ra* 08/28/2019 83   Calcium 08/28/2019 9.2   AST 08/28/2019 15   ALT 08/28/2019 14   Alk Phos (alkaline Phosp* 08/28/2019 95   Albumin 08/28/2019 4.2   Bilirubin, Total 08/28/2019 0.7   Protein, Total 08/28/2019 6.4   A/G Ratio 08/28/2019 1.9   WBC (White Blood Cell Co* 08/28/2019 4.9   RBC (Red Blood Cell Coun* 08/28/2019 4.62*   Hemoglobin 08/28/2019 14.8   Hematocrit 08/28/2019 44.0   MCV (Mean Corpuscular Vo* 08/28/2019 95.2   MCH (Mean  Corpuscular He* 08/28/2019 32.0*   MCHC (Mean Corpuscular H* 08/28/2019 33.6   Platelet Count 08/28/2019 245   RDW-CV (Red Cell Distrib* 08/28/2019 13.4   MPV (Mean Platelet Volum* 08/28/2019 9.0*   Neutrophils 08/28/2019 1.97   Lymphocytes 08/28/2019 1.84   Monocytes 08/28/2019 0.58   Eosinophils 08/28/2019 0.45   Basophils 08/28/2019 0.02   Neutrophil % 08/28/2019 40.5   Lymphocyte % 08/28/2019 37.8   Monocyte % 08/28/2019 11.9   Eosinophil % 08/28/2019 9.2*   Basophil% 08/28/2019 0.4   Immature Granulocyte % 08/28/2019 0.2   Immature Granulocyte Cou* 08/28/2019 0.01   Cholesterol, Total 08/28/2019 194   Triglyceride 08/28/2019 73   HDL (High Density Lipopr* 08/28/2019 52.2   LDL Calculated 08/28/2019 127   VLDL Cholesterol 08/28/2019 15   Cholesterol/HDL Ratio 08/28/2019 3.7   Color 08/28/2019 Yellow   Clarity 08/28/2019 Clear   Specific Gravity 08/28/2019 >=1.030   pH, Urine 08/28/2019 6.0   Protein, Urinalysis 08/28/2019 Negative   Glucose, Urinalysis 08/28/2019 Negative   Ketones, Urinalysis 08/28/2019 Negative   Blood, Urinalysis 08/28/2019 Negative   Nitrite, Urinalysis 08/28/2019 Negative   Leukocyte Esterase, Urin* 08/28/2019 Negative   White Blood Cells, Urina* 08/28/2019 None Seen   Red Blood Cells, Urinaly* 08/28/2019 None Seen   Bacteria, Urinalysis 08/28/2019 None Seen   Squamous Epithelial Cell* 08/28/2019 None Seen   Imaging: See Patient Profile. Reviewed in [facility].  Procedures: GI procedures reviewed in Surgical History.  Assessment & Plan   Mr. Mcalexander is a 69 y.o. male seen today for:  1. Family history of polyps in the colon Due for high-risk screening colonoscopy. No CRC alarm symptoms. Suspect softer stools are due to increase in soluble fiber through increased oatmeal intake, but encouraged patient to notify GI if symptoms worsen or new symptoms arise. Otherwise, patient is an appropriate candidate  for elective sedated procedures based on my review of their past medical history.  - Ambulatory Referral to Colonoscopy  Orders   Procedure orders: The procedure, risks, and benefits were discussed in detail, including, but not limited to bleeding, infection, perforation, and difficulty with sedation. The patient voiced their understanding and all questions were answered.  Procedure: High-risk screening colonoscopy. Location: [facility] - prefers Dr. Bary Castilla who previously performed laparoscopic partial colectomy and hernia repair. Prep: Miralax/Sugar-free Gatorade. Clearances: None. Anticoagulation: None.  No orders of the defined types were placed in this encounter.  Requested Prescriptions   No prescriptions requested or ordered in this encounter   Follow-up   No follow-ups on file.  This visit consisted of 50 minutes of total care. This includes 30 minutes of face-to-face contact with Sharyn Lull C. McGhee, PA-C, at least 50% of which was spent in counseling and education regarding diagnosis, treatment options, medication management, risks and benefits of treatment. The additional time was spend reviewing available records. All questions were adequately answered to patient's satisfaction. Patient in agreement with plan of care.  I personally performed the service. The  above plan of care was discussed and agreed upon under supervisoratory agreement between myself and Dr. Robet Leu.  [facility]le [facility], [facility] [place], [place] [address]

## 2020-04-20 ENCOUNTER — Ambulatory Visit (INDEPENDENT_AMBULATORY_CARE_PROVIDER_SITE_OTHER): Payer: Medicare Other | Admitting: Podiatry

## 2020-04-20 ENCOUNTER — Other Ambulatory Visit: Payer: Self-pay

## 2020-04-20 DIAGNOSIS — M2041 Other hammer toe(s) (acquired), right foot: Secondary | ICD-10-CM

## 2020-04-20 DIAGNOSIS — L600 Ingrowing nail: Secondary | ICD-10-CM

## 2020-04-20 DIAGNOSIS — Z79899 Other long term (current) drug therapy: Secondary | ICD-10-CM | POA: Diagnosis not present

## 2020-04-20 DIAGNOSIS — B351 Tinea unguium: Secondary | ICD-10-CM

## 2020-04-20 DIAGNOSIS — M79674 Pain in right toe(s): Secondary | ICD-10-CM

## 2020-04-20 DIAGNOSIS — M21622 Bunionette of left foot: Secondary | ICD-10-CM

## 2020-04-20 DIAGNOSIS — M79675 Pain in left toe(s): Secondary | ICD-10-CM

## 2020-04-20 DIAGNOSIS — M21621 Bunionette of right foot: Secondary | ICD-10-CM

## 2020-04-20 MED ORDER — TERBINAFINE HCL 250 MG PO TABS: 250.0000 mg | ORAL_TABLET | Freq: Every day | ORAL | 0 refills | Status: DC

## 2020-04-20 MED ORDER — DOXYCYCLINE HYCLATE 100 MG PO TABS: 100.0000 mg | ORAL_TABLET | Freq: Two times a day (BID) | ORAL | 0 refills | Status: DC

## 2020-04-20 MED ORDER — GENTAMICIN SULFATE 0.1 % EX CREA: 1.0000 "application " | TOPICAL_CREAM | Freq: Two times a day (BID) | CUTANEOUS | 1 refills | Status: DC

## 2020-04-20 NOTE — Progress Notes (Signed)
   Subjective: Patient presents today for evaluation of pain to the medial lateral border of the bilateral great toes. Patient is concerned for possible ingrown nail.  Patient states that he is dealt with ingrown toenails for several years now.  Normally he tries to treat them himself.  Most recently he has had knee problems and cannot address the toenails by himself.  Patient presents today for further treatment and evaluation.  Past Medical History:  Diagnosis Date  . Anemia    iron deficiency  . Diverticulosis   . Headache   . Hyperlipidemia   . Lumbago   . Prostatitis     Objective:  General: Well developed, nourished, in no acute distress, alert and oriented x3   Dermatology: Skin is warm, dry and supple bilateral.  Medial lateral border bilateral great toes appears to be erythematous with evidence of an ingrowing nail. Pain on palpation noted to the border of the nail fold. There are no open sores, lesions.  Hyperkeratotic dystrophic thickened discolored nails noted specifically to the bilateral great toes   Vascular: Dorsalis Pedis artery and Posterior Tibial artery pedal pulses palpable. No lower extremity edema noted.   Neruologic: Grossly intact via light touch bilateral.  Musculoskeletal: Muscular strength within normal limits in all groups bilateral. Normal range of motion noted to all pedal and ankle joints.  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 Patient evaluated.  2. Discussed treatment alternatives and plan of care. Explained nail avulsion procedure and post procedure course to patient. 3. Patient opted for permanent partial nail avulsion of the medial lateral border  bilateral great toes.  4. Prior to procedure, local anesthesia infiltration utilized using 3 ml of a 50:50 mixture of 2% plain lidocaine and 0.5% plain marcaine in a normal hallux block fashion and a betadine prep performed.  5. Partial permanent nail avulsion with chemical matrixectomy performed using 3x30sec applications of phenol followed by alcohol flush.  6. Light dressing applied. 7.  Prescription for gentamicin cream applied daily  8.  Order placed for hepatic function panel  9.  Prescription for Lamisil 250 mg #90 daily.   10.  Return to clinic 3 weeks.  Felecia Shelling, DPM [facility]  Dr. Felecia Shelling, DPM    [address]                                        [place], [place] [address]                Office [phone]  Fax [phone]

## 2020-05-11 ENCOUNTER — Ambulatory Visit (INDEPENDENT_AMBULATORY_CARE_PROVIDER_SITE_OTHER): Payer: Medicare Other | Admitting: Podiatry

## 2020-05-11 ENCOUNTER — Other Ambulatory Visit: Payer: Self-pay

## 2020-05-11 ENCOUNTER — Ambulatory Visit (INDEPENDENT_AMBULATORY_CARE_PROVIDER_SITE_OTHER): Payer: Medicare Other

## 2020-05-11 ENCOUNTER — Encounter: Payer: Self-pay | Admitting: Podiatry

## 2020-05-11 DIAGNOSIS — M25871 Other specified joint disorders, right ankle and foot: Secondary | ICD-10-CM | POA: Diagnosis not present

## 2020-05-11 DIAGNOSIS — M2041 Other hammer toe(s) (acquired), right foot: Secondary | ICD-10-CM

## 2020-05-11 DIAGNOSIS — L600 Ingrowing nail: Secondary | ICD-10-CM | POA: Diagnosis not present

## 2020-05-11 MED ORDER — BETAMETHASONE SOD PHOS & ACET 6 (3-3) MG/ML IJ SUSP
3.0000 mg | Freq: Once | INTRAMUSCULAR | Status: AC
  Administered 2020-05-11: 3 mg via INTRAMUSCULAR

## 2020-05-11 NOTE — Progress Notes (Signed)
   Subjective: 69 y.o. male presents today status post permanent nail avulsion procedure of the medial and lateral border of the right great toe as well as the lateral border of the left great toe that was performed on 04/20/2020.  Patient states that he is doing well.  He took the antibiotics as prescribed. Patient does have a new complaint today regarding right great toe pain.  He failed to mention this last visit.  He states that the weightbearing surface of the right great toe has been symptomatic and painful for a while.  He cannot recall an incident that would have aggravated the foot.  He has not done anything for treatment.  He presents for further treatment and evaluation  Past Medical History:  Diagnosis Date  . Anemia    iron deficiency  . Diverticulosis   . Headache   . Hyperlipidemia   . Lumbago   . Prostatitis     Objective: Skin is warm, dry and supple. Nail and respective nail fold appears to be healing appropriately. Open wound to the associated nail fold with a granular wound base and moderate amount of fibrotic tissue. Minimal drainage noted. Mild erythema around the periungual region likely due to phenol chemical matricectomy.  There is some pain on palpation range of motion to the sesamoidal apparatus of the right foot  Radiographic exam Hallux valgus noted with an increased intermetatarsal angle right foot.  Subluxation/dislocation of the third MTPJ also noted to the right foot.  No fracture identified.  Normal osseous mineralization.  Assessment: #1 postop permanent partial nail avulsion medial lateral border bilateral great toes #2  Sesamoiditis right first MTPJ  Plan of care: #1 patient was evaluated  #2 debridement of open wound was performed to the periungual border of the respective toe using a currette. Antibiotic ointment and Band-Aid was applied. #3  Injection of 0.5 cc Celestone Soluspan injected into the first MTPJ right foot #4 recommend good  supportive shoes and sneakers #5 return to clinic as needed   Felecia Shelling, DPM [facility]  Dr. Felecia Shelling, DPM    [address] [place], [place] [address]                Office [phone]  Fax [phone]

## 2020-06-03 ENCOUNTER — Other Ambulatory Visit: Payer: Self-pay | Admitting: Podiatry

## 2020-06-03 DIAGNOSIS — M25871 Other specified joint disorders, right ankle and foot: Secondary | ICD-10-CM

## 2020-06-23 ENCOUNTER — Other Ambulatory Visit: Payer: Self-pay | Admitting: Internal Medicine

## 2020-06-23 DIAGNOSIS — R519 Headache, unspecified: Secondary | ICD-10-CM

## 2020-07-06 ENCOUNTER — Ambulatory Visit
Admission: RE | Admit: 2020-07-06 | Discharge: 2020-07-06 | Disposition: A | Discharge status: Home / Self Care | Payer: Medicare Other | Source: Ambulatory Visit | Attending: Internal Medicine | Admitting: Internal Medicine

## 2020-07-06 ENCOUNTER — Other Ambulatory Visit: Payer: Self-pay

## 2020-07-06 DIAGNOSIS — R519 Headache, unspecified: Secondary | ICD-10-CM | POA: Insufficient documentation

## 2020-07-29 ENCOUNTER — Other Ambulatory Visit: Payer: Self-pay | Admitting: Internal Medicine

## 2020-07-29 DIAGNOSIS — R591 Generalized enlarged lymph nodes: Secondary | ICD-10-CM

## 2020-08-13 ENCOUNTER — Ambulatory Visit
Admission: RE | Admit: 2020-08-13 | Discharge: 2020-08-13 | Disposition: A | Discharge status: Home / Self Care | Payer: Medicare Other | Source: Ambulatory Visit | Attending: Internal Medicine | Admitting: Internal Medicine

## 2020-08-13 ENCOUNTER — Other Ambulatory Visit: Payer: Self-pay

## 2020-08-13 DIAGNOSIS — R591 Generalized enlarged lymph nodes: Secondary | ICD-10-CM | POA: Insufficient documentation

## 2020-10-01 ENCOUNTER — Encounter: Payer: Self-pay | Admitting: Podiatry

## 2020-10-01 ENCOUNTER — Other Ambulatory Visit: Payer: Self-pay

## 2020-10-01 ENCOUNTER — Ambulatory Visit (INDEPENDENT_AMBULATORY_CARE_PROVIDER_SITE_OTHER): Payer: Medicare Other | Admitting: Podiatry

## 2020-10-01 DIAGNOSIS — B070 Plantar wart: Secondary | ICD-10-CM

## 2020-10-01 NOTE — Progress Notes (Signed)
   Subjective: 70 y.o. male presenting to the office today for evaluation of symptomatic skin lesions to the bilateral feet that developed over the past few months.  He has been using an Landscape architect to try to shave them down with minimal improvement.  He presents for further treatment and evaluation    Past Medical History:  Diagnosis Date  . Anemia    iron deficiency  . Diverticulosis   . Headache   . Hyperlipidemia   . Lumbago   . Prostatitis     Objective: Physical Exam General: The patient is alert and oriented x3 in no acute distress.   Dermatology: Hyperkeratotic skin lesions noted to the plantar aspect of the bilateral feet. Pinpoint bleeding noted upon debridement. Skin is warm, dry and supple bilateral lower extremities. Negative for open lesions or macerations.   Vascular: Palpable pedal pulses bilaterally. No edema or erythema noted. Capillary refill within normal limits.   Neurological: Epicritic and protective threshold grossly intact bilaterally.    Musculoskeletal Exam: Pain on palpation to the noted skin lesions.  Range of motion within normal limits to all pedal and ankle joints bilateral. Muscle strength 5/5 in all groups bilateral.    Assessment: 1. plantar wart bilateral feet    Plan of Care:  1. Patient was evaluated. 2. Excisional debridement of the plantar wart lesion(s) was performed using a chisel blade.  Salicylic acid was applied and the lesion(s) was dressed with a dry sterile dressing. 3.  Recommend OTC salicylic acid daily  4.  Continue emery board 5.  Return to clinic as needed  Felecia Shelling, DPM [facility]  Dr. Felecia Shelling, DPM    [address] [place], [place] [address]                Office [phone]  Fax [phone]

## 2022-05-24 IMAGING — CT CT HEAD W/O CM
4 series · 15 of 47 positions shown, 17 images · non-contrast
Comparison: None.

CLINICAL DATA: electric tooth brush he has a pain on left forehead
at times, he states he has reoccurring left forehead pain

EXAM:
CT HEAD WITHOUT CONTRAST
TECHNIQUE: Contiguous axial images were obtained from the base of the skull
through the vertex without intravenous contrast.

[Series 2: axial st head 5.00 ax · axial · 0.38mm/px · z∈[-536,-421]mm · 7 of 31 slices shown, 9 images]
[im 4/31  brain]
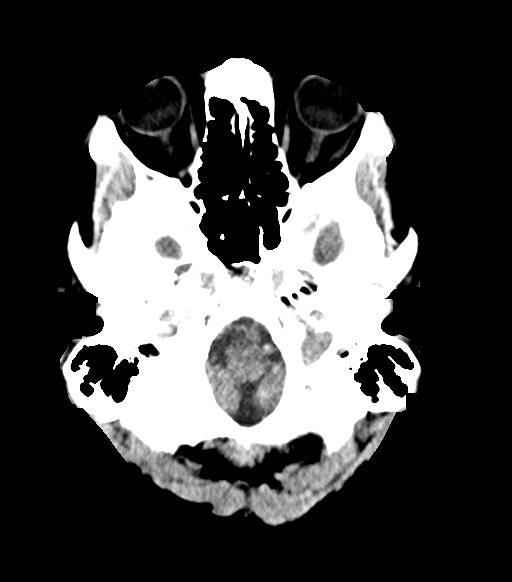
[im 4/31  bone]
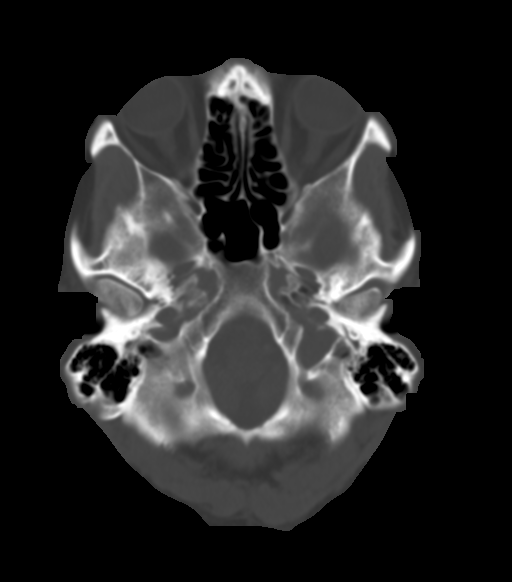
[im 8/31  brain]
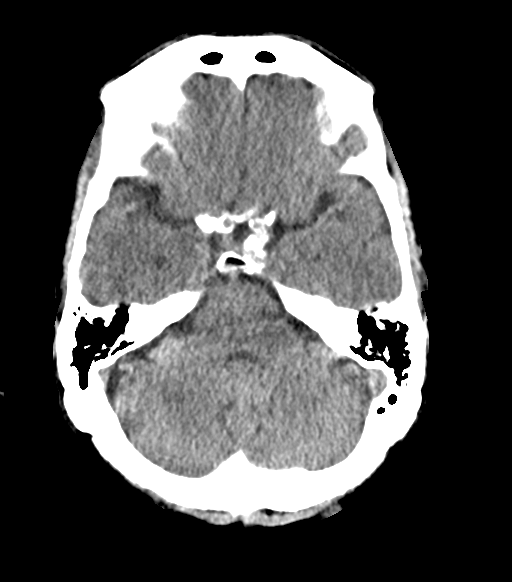
[im 12/31  brain]
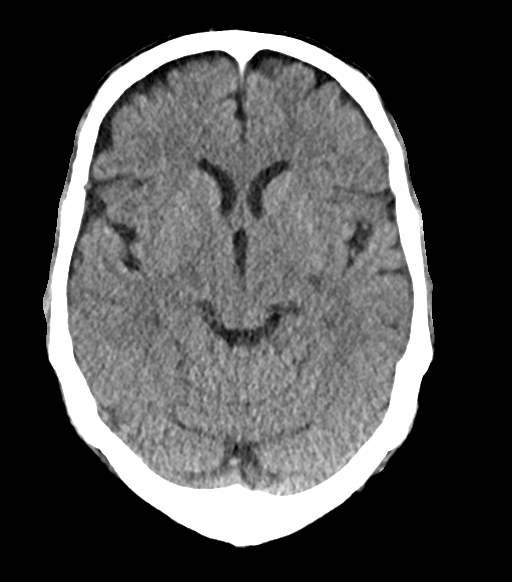
[im 16/31  brain]
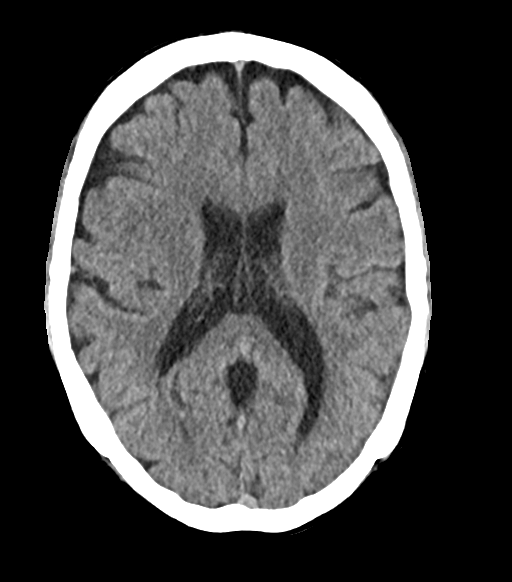
[im 19/31  brain]
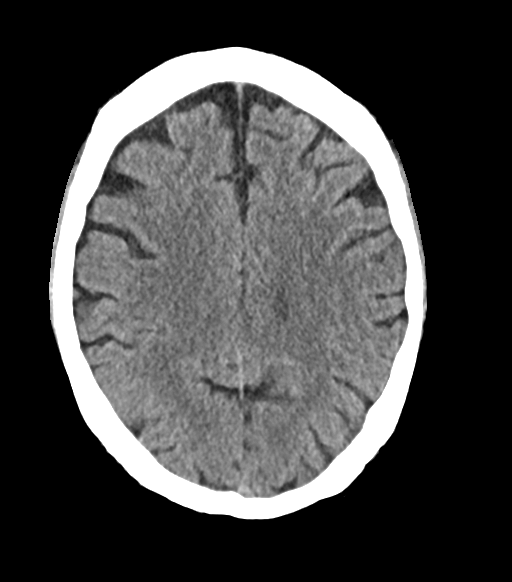
[im 19/31  bone]
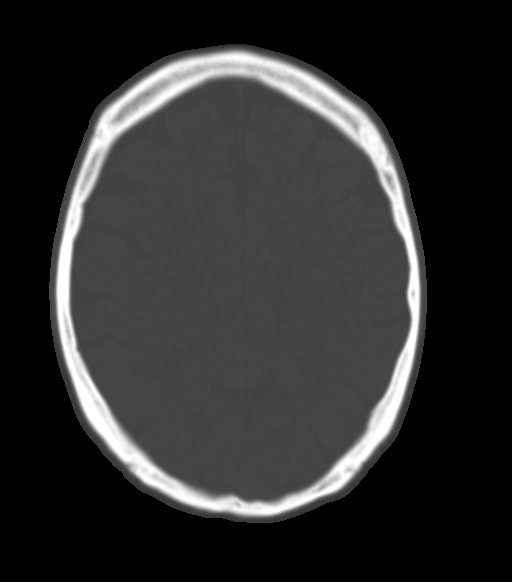
[im 23/31  brain]
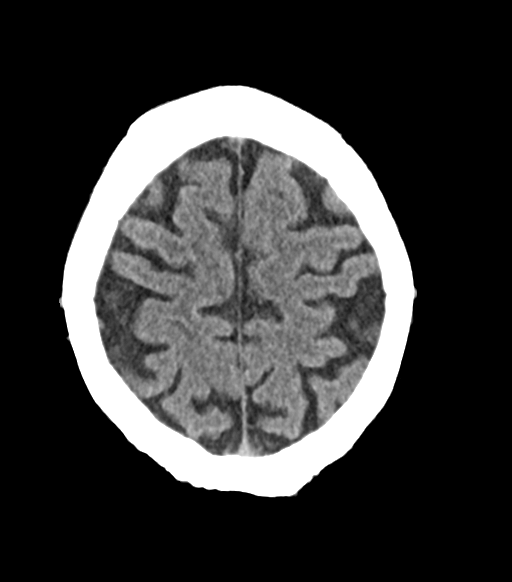
[im 27/31  brain]
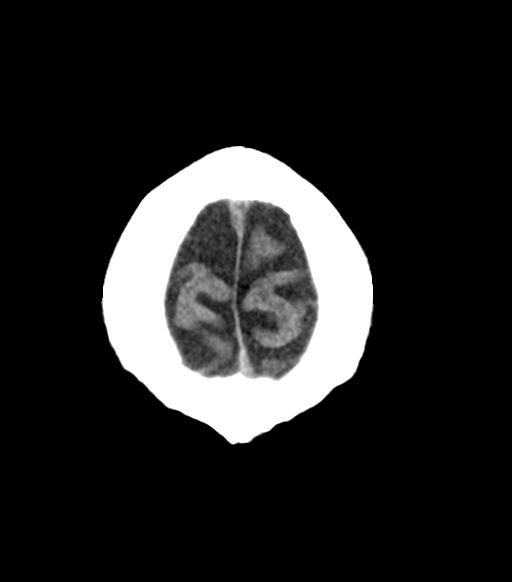

[Series 4: bone windows head 2.00 ax · axial · 0.38mm/px · z∈[-538,-523]mm · 2 of 78 slices shown]
[im 8/78  bone]
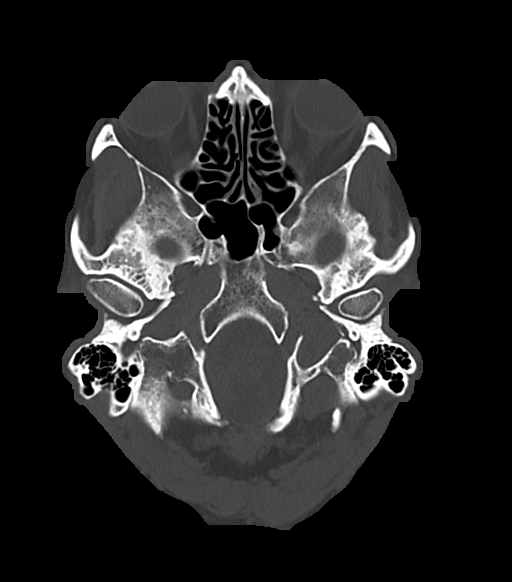
[im 16/78  bone]
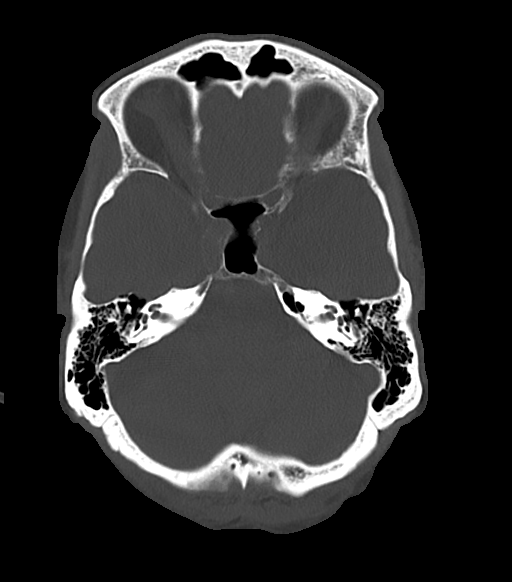

[Series 6: coronals head 3.00 cor · coronal · 0.31mm/px · 3 of 74 slices shown]
[im 25/74  brain]
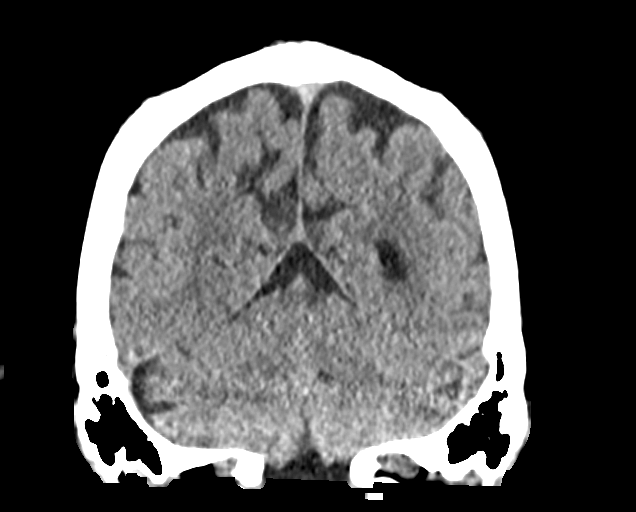
[im 33/74  brain]
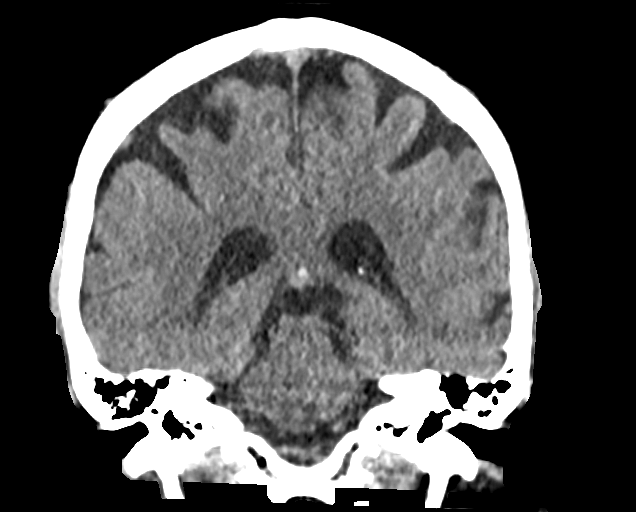
[im 41/74  brain]
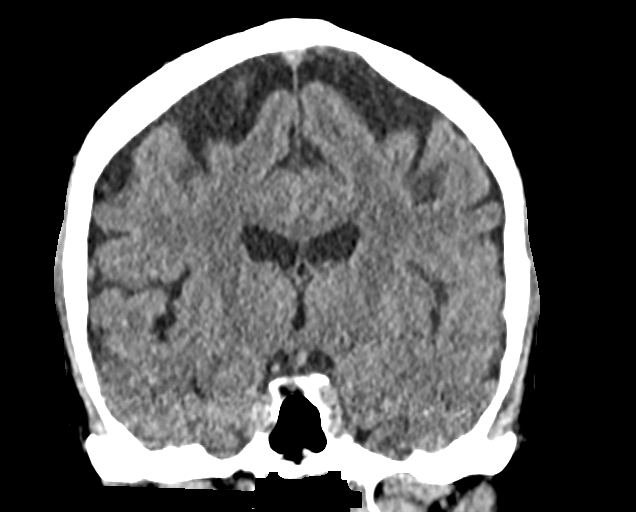

[Series 8: sagittals head 3.00 sag · sagittal · 0.31mm/px · 3 of 65 slices shown]
[im 22/65  brain]
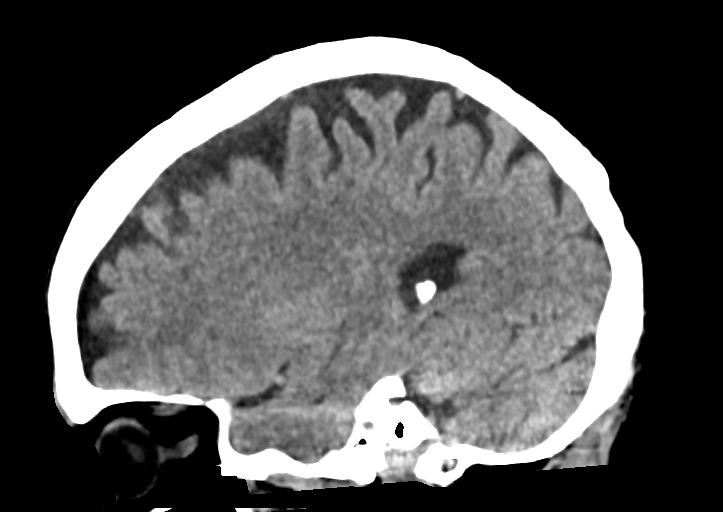
[im 33/65  brain]
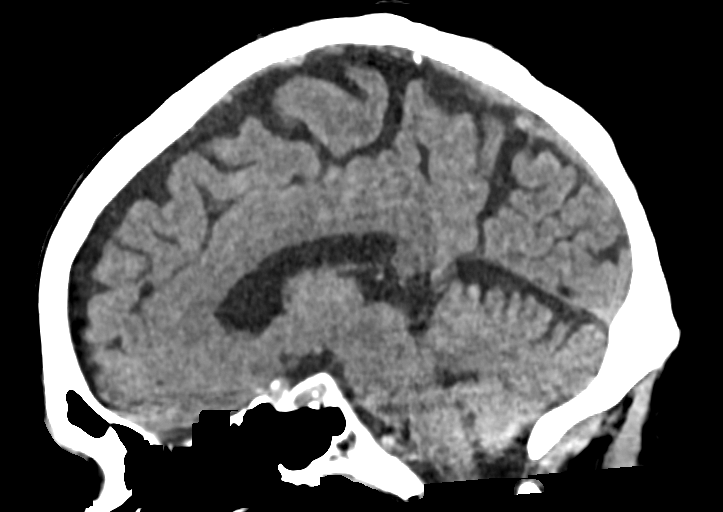
[im 43/65  brain]
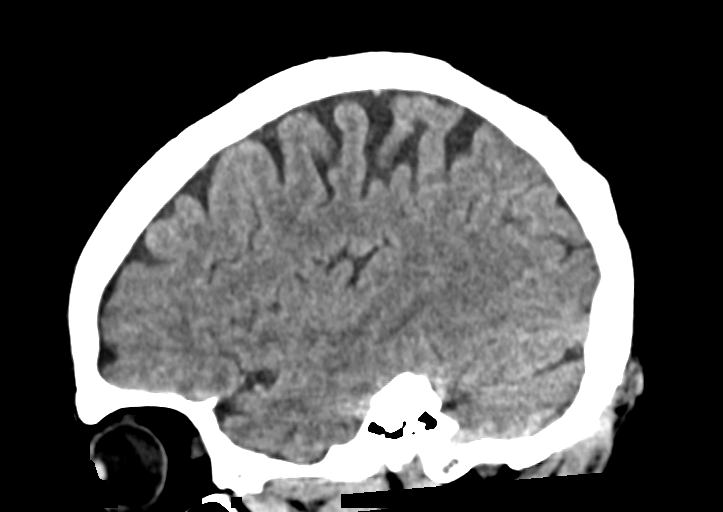

[15 of 47 positions shown; findings below may reference images not displayed]

FINDINGS: Brain:

No evidence of large-territorial acute infarction. No parenchymal
hemorrhage. No mass lesion. No extra-axial collection.

No mass effect or midline shift. No hydrocephalus. Basilar cisterns
are patent.

Vascular: No hyperdense vessel. Atherosclerotic calcifications are
present within the cavernous internal carotid and vertebral
arteries.

Skull: No acute fracture or focal lesion.

Sinuses/Orbits: Paranasal sinuses and mastoid air cells are clear.
The orbits are unremarkable.

Other: None.
IMPRESSION: No acute intracranial abnormality.

## 2022-07-01 IMAGING — CT CT NECK W/O CM
3 of 5 series · 12 of 33 positions shown, 14 images · non-contrast
Comparison: None.

CLINICAL DATA: Left submandibular swelling.

EXAM:
CT NECK WITHOUT CONTRAST
TECHNIQUE: Multidetector CT imaging of the neck was performed following the
standard protocol without intravenous contrast.

[Series 4: cor neck neck (person_name) 2.00 cor · coronal · 0.58mm/px · 3 of 122 slices shown]
[im 44/122  bone]
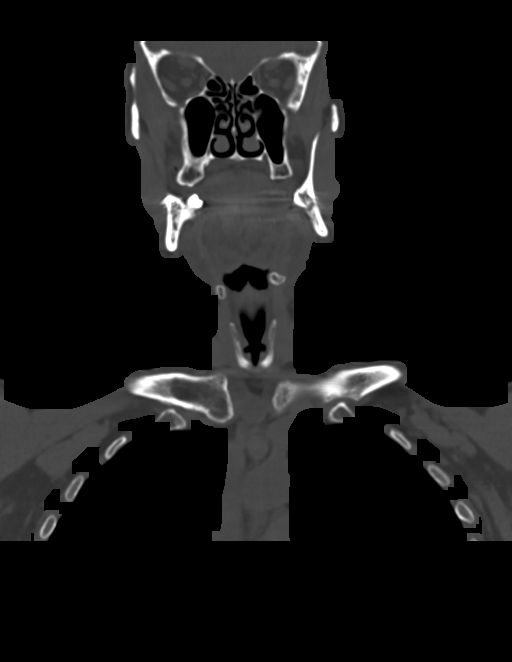
[im 55/122  bone]
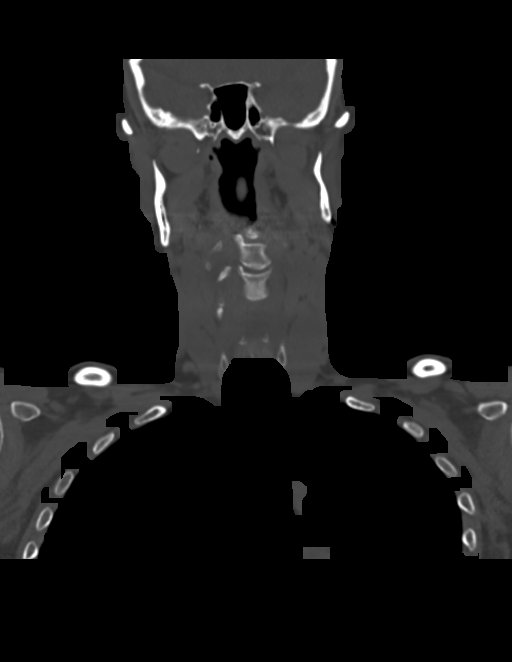
[im 67/122  bone]
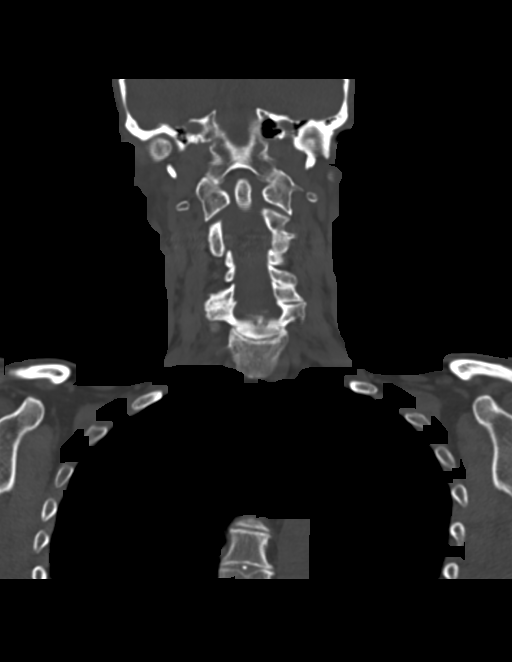

[Series 6: sag neck neck (person_name) 2.00 sag · sagittal · 0.48mm/px · 5 of 148 slices shown, 6 images]
[im 50/148  bone]
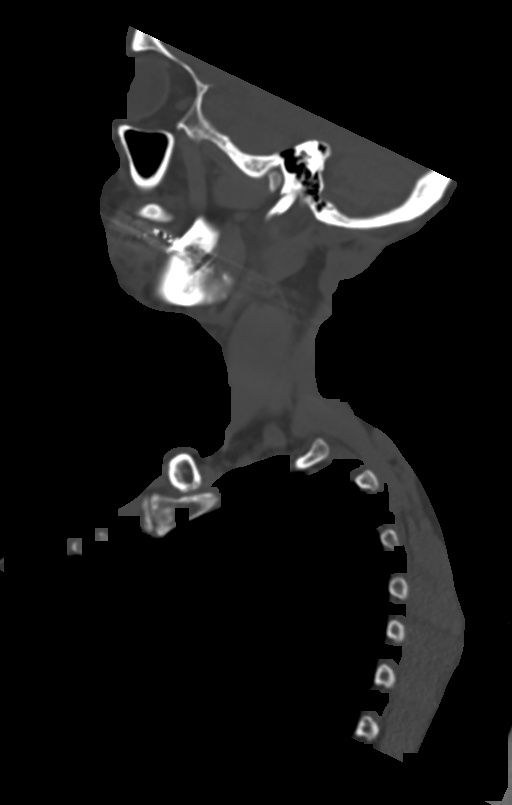
[im 62/148  bone]
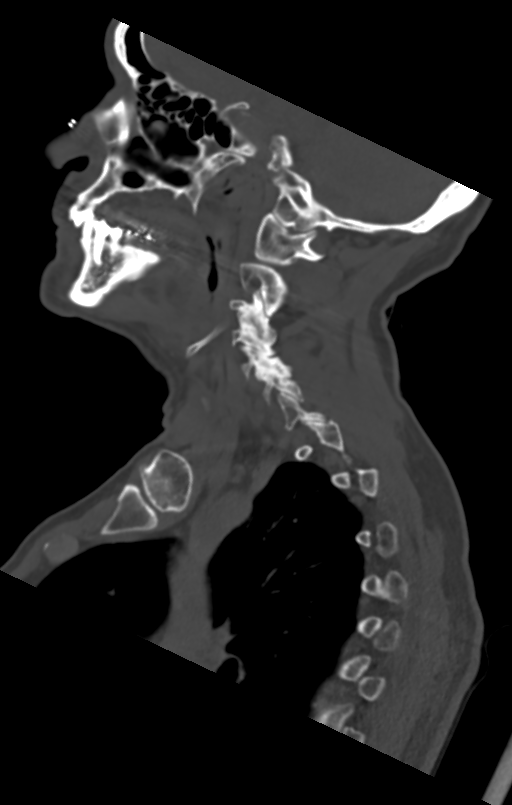
[im 74/148  soft-tissue]
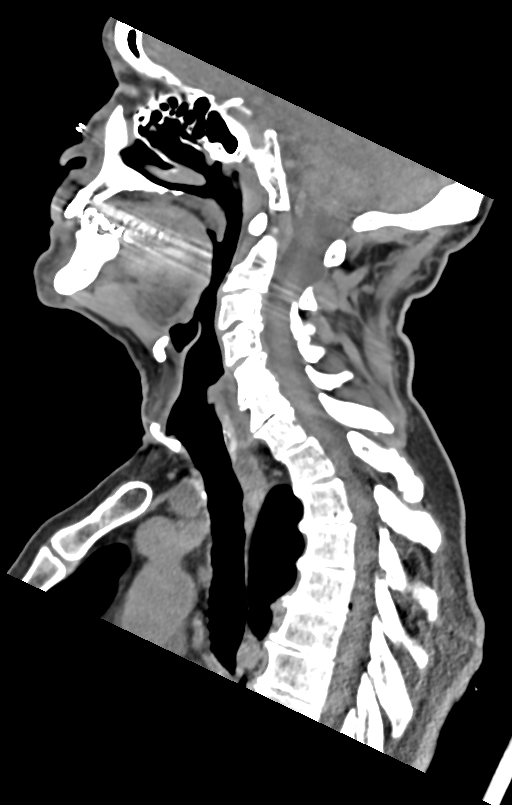
[im 74/148  bone]
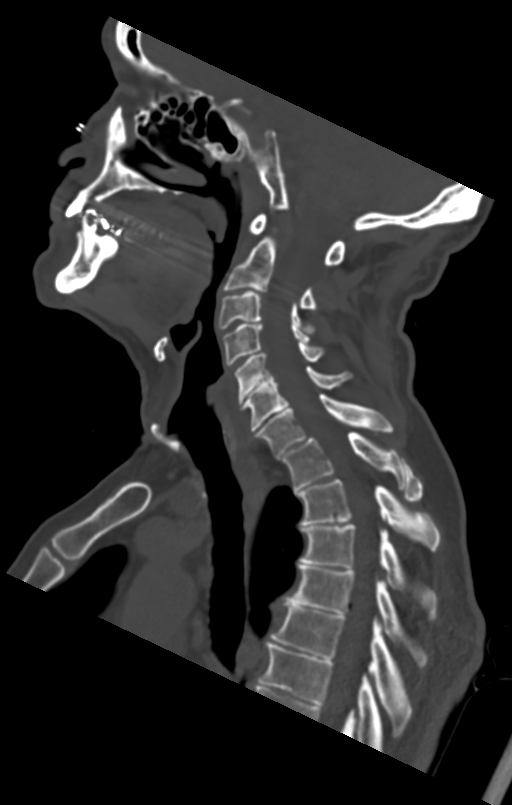
[im 86/148  bone]
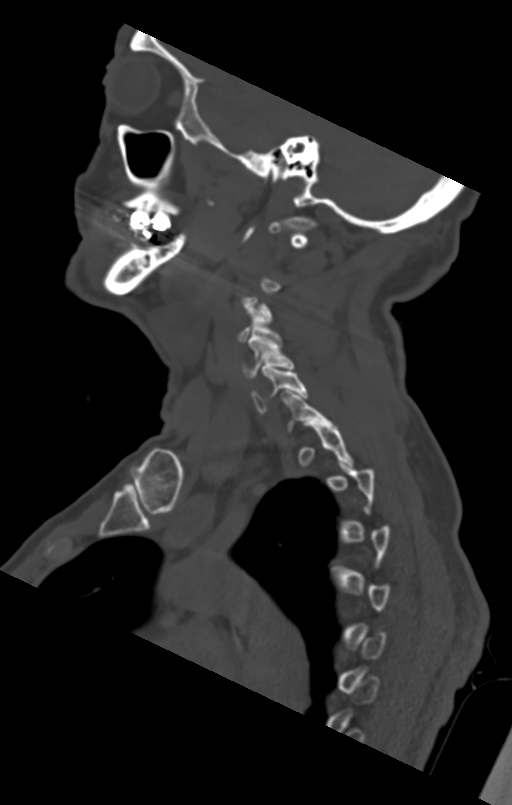
[im 99/148  bone]
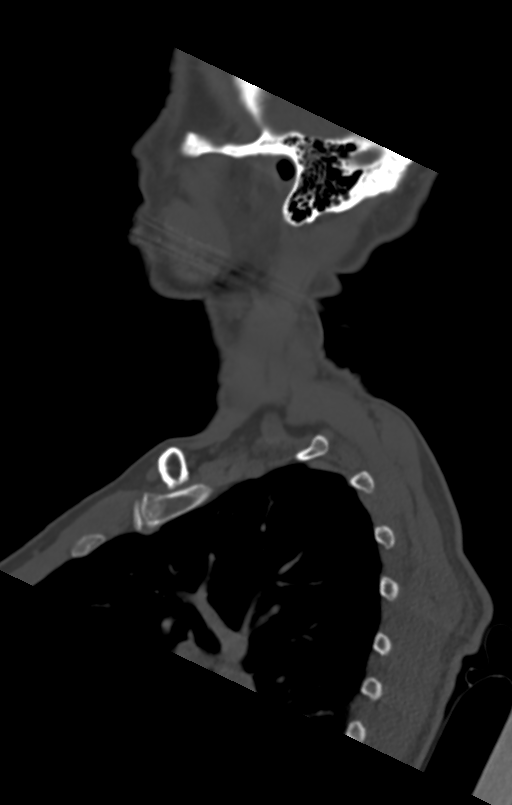

[Series 8: ax oropharynx neck neck (person_name) 2.00 ax · axial · 0.48mm/px · z∈[-803,-597]mm · 4 of 192 slices shown, 5 images]
[im 39/192  soft-tissue]
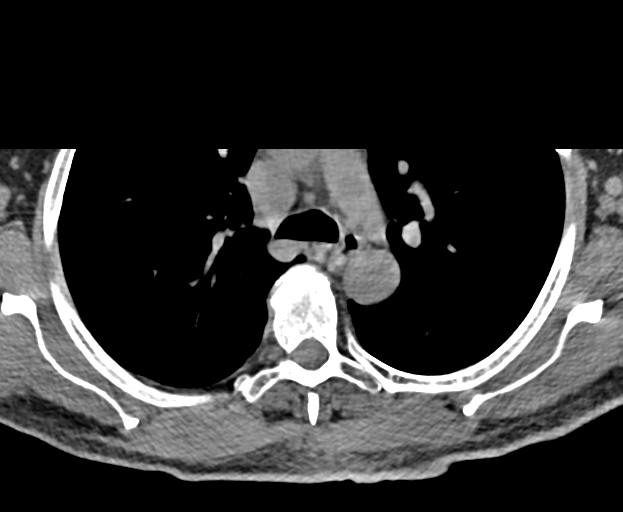
[im 39/192  bone]
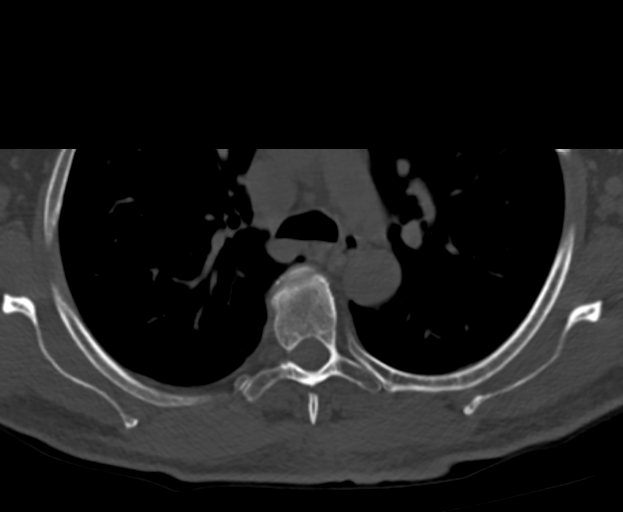
[im 77/192  bone]
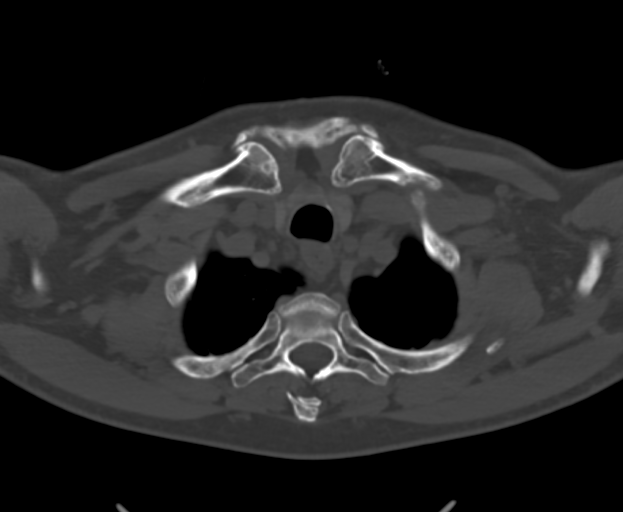
[im 115/192  bone]
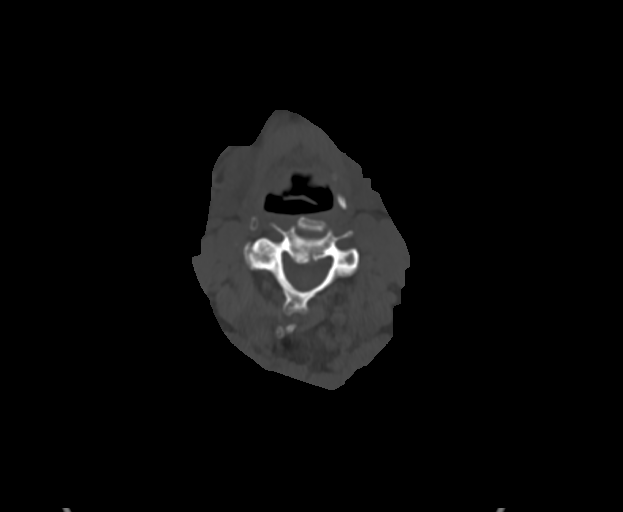
[im 153/192  bone]
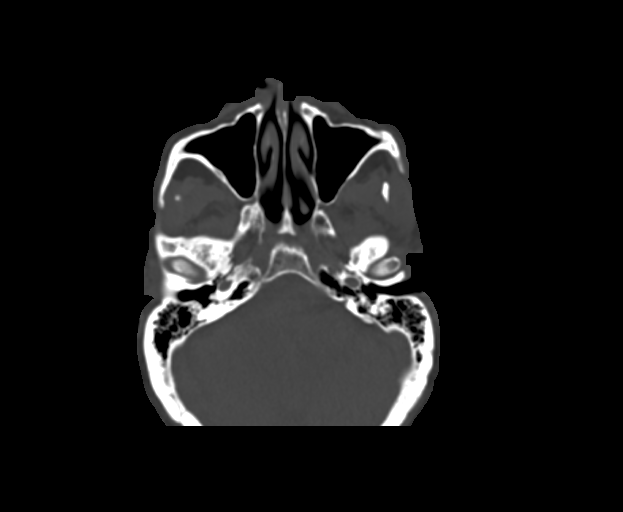

[12 of 33 positions shown; findings below may reference images not displayed]

FINDINGS: Pharynx and larynx: Nasopharynx is within normal limits. The soft
palate and tongue base are unremarkable. Parapharyngeal fat is clear
bilaterally. Vallecula and epiglottis are within normal limits.
Aryepiglottic folds and piriform sinuses are clear. Vocal cords are
midline and symmetric. Trachea is clear.

Salivary glands: The submandibular and parotid glands and ducts are
within normal limits. Marker is placed at the level of the left
submandibular gland. Glands are symmetric in size and density.

Thyroid: 1.7 cm incidental hypodense thyroid nodule noted in the
isthmus. Smaller nodule in the inferior left lower lobe.

Lymph nodes: No significant cervical adenopathy is present.

Vascular: Unremarkable noncontrast appearance of the vasculature.

Limited intracranial: Visualized intracranial contents are within
normal limits.

Visualized orbits: The globes and orbits are within normal limits.

Mastoids and visualized paranasal sinuses: The paranasal sinuses and
mastoid air cells are clear.

Skeleton: Multilevel degenerative changes are present in the
cervical spine. Exaggerated upper thoracic kyphosis noted. No focal
lytic or blastic lesions present.

Upper chest: The lung apices are clear. Thoracic inlet is within
normal limits. The mediastinum is unremarkable.
IMPRESSION: 1. No acute or focal lesion to explain the patient's symptoms.
Normal noncontrast CT appearance of the left submandibular gland.
2. No significant cervical adenopathy.
3. Multilevel degenerative changes of the cervical spine.
4. 1.7 cm incidental thyroid nodule in the isthmus. Recommend
thyroid US (ref: [HOSPITAL]. [DATE]): 143-50).

## 2023-08-28 ENCOUNTER — Ambulatory Visit: Payer: Self-pay | Admitting: General Surgery

## 2023-08-28 NOTE — H&P (View-Only) (Signed)
 History of Present Illness Russell Snow is a 73 year old male with polymyalgia rheumatica who presents with headaches for evaluation of temporal arteritis. He was referred by Dr. Allena Katz, his rheumatologist, for evaluation of headaches and concern for temporal arteritis.  He has been experiencing headaches localized to the left temporal area for the past two months. The pain is mid-range in intensity, lasting from one to three minutes, and occurs sporadically, sometimes twice or three times a week, and other times only once a week or not at all. The last episode occurred midweek last week. No associated jaw pain, vision loss, nausea, vomiting, or aura preceding the headaches.  He has a history of polymyalgia rheumatica and has previously been on high-dose steroids, which he took for a prolonged period until his sedimentation rates normalized. He started high-dose steroid treatment yesterday morning.  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      PAST MEDICAL HISTORY:  Past Medical History:  Diagnosis Date   Anemia    iron deficiency   Chicken pox    History of diverticulosis    History of prostatitis    Hyperlipidemia    Lumbago    Migraine headache    Seasonal allergies    chronic        PAST SURGICAL HISTORY:   Past Surgical History:  Procedure Laterality Date   Partial colectomy secondary to diverticulosis  1996   EGD  06/08/2006   COLONOSCOPY  08/26/2013   FHCP (Mother/Brother) CBF 08/2018: Recall ltr mailed    INCISION TENDON SHEATH FOR TRIGGER FINGER Left 01/21/2020   THUMB   COLONOSCOPY  02/06/2020   Diverticulosis/Otherwise normal/FHx CP/Repeat 42yrs/JWB   COLONOSCOPY  12/31/2002, 09/08/2008   FHCP (Mother/Brother)    excision morton's neuroma      TONSILLECTOMY           MEDICATIONS:  Outpatient Encounter Medications as of 08/28/2023  Medication Sig Dispense Refill   calcium citrate-vitamin D3 1,000 mg-10 mcg /30 mL Liqd Take by mouth     cyanocobalamin (VITAMIN B12) 1000 MCG tablet Take 1,000 mcg by mouth once daily     predniSONE (DELTASONE) 20 MG tablet Take 3 tablets (60 mg total) by mouth once daily 90 tablet 1   fexofenadine (ALLEGRA) 60 MG tablet Take 60 mg by mouth once daily as needed (Patient not taking: Reported on 08/28/2023)     fluticasone propionate (FLONASE) 50 mcg/actuation nasal spray Place 2 sprays into both nostrils once daily as needed (Patient not taking: Reported on 08/28/2023) 16 g 2   No facility-administered encounter medications on file as of 08/28/2023.     ALLERGIES:   Patient has no known allergies.   SOCIAL HISTORY:  Social History   Socioeconomic History   Marital status: Married  Occupational History   Occupation: Retired  Tobacco Use   Smoking status: Former    Current packs/day: 0.00    Types: Cigarettes    Quit date: 05/22/1976    Years since quitting: 47.2   Smokeless tobacco: Never  Vaping Use   Vaping status: Never Used  Substance and Sexual Activity   Alcohol use: Yes    Alcohol/week: 0.0 standard drinks of alcohol    Comment: occasionally   Drug use: No   Sexual activity: Yes  Partners: Female   Social Drivers of Corporate investment banker Strain: Low Risk  (08/28/2023)   Overall Financial Resource Strain (CARDIA)    Difficulty of Paying Living Expenses: Not hard at all  Food Insecurity: No Food Insecurity (08/28/2023)   Hunger Vital Sign    Worried About Running Out of Food in the Last Year: Never true    Ran Out of Food in the Last Year: Never true  Transportation Needs: No Transportation Needs (08/28/2023)   PRAPARE - Administrator, Civil Service (Medical): No    Lack of Transportation (Non-Medical): No    FAMILY HISTORY:  Family History  Problem Relation  Name Age of Onset   COPD Mother     Myocardial Infarction (Heart attack) Mother     Arthritis Mother     Colon polyps Mother     Diabetes type II Father     High blood pressure (Hypertension) Sister     Diverticulosis Brother     Colon polyps Brother     Fibromyalgia Sister       GENERAL REVIEW OF SYSTEMS:   General ROS: negative for - chills, fatigue, fever, weight gain or weight loss Allergy and Immunology ROS: negative for - hives  Hematological and Lymphatic ROS: negative for - bleeding problems or bruising, negative for palpable nodes Endocrine ROS: negative for - heat or cold intolerance, hair changes Respiratory ROS: negative for - cough, shortness of breath or wheezing Cardiovascular ROS: no chest pain or palpitations GI ROS: negative for nausea, vomiting, abdominal pain, diarrhea, constipation Musculoskeletal ROS: negative for - joint swelling or muscle pain Neurological ROS: negative for - confusion, syncope Dermatological ROS: negative for pruritus and rash  PHYSICAL EXAM:  Vitals:   08/28/23 0723  BP: 130/82  Pulse: 72  .  Ht:172.7 cm (5\' 8" ) Wt:73.5 kg (162 lb) OFB:PZWC surface area is 1.88 meters squared. Body mass index is 24.63 kg/m.Marland Kitchen   GENERAL: Alert, active, oriented x3  HEENT: Pupils equal reactive to light. Extraocular movements are intact. Sclera clear. Palpebral conjunctiva normal red color.Pharynx clear.  Palpable temporal artery.  No tenderness on palpation.  NECK: Supple with no palpable mass and no adenopathy.  LUNGS: Sound clear with no rales rhonchi or wheezes.  HEART: Regular rhythm S1 and S2 without murmur.   EXTREMITIES: Well-developed well-nourished symmetrical with no dependent edema.  NEUROLOGICAL: Awake alert oriented, facial expression symmetrical, moving all extremities.   Results CRP in process Sed rate: 2     Assessment & Plan Giant Cell Arteritis   He presents with left-sided temporal headaches lasting 1-3 minutes,  occurring intermittently over the past two months. The pain is mid-range in severity without jaw pain, vision loss, nausea, vomiting, or aura. A rheumatologist suspects giant cell arteritis (GCA) and has initiated high-dose steroid treatment. The primary concern with GCA is the risk of permanent vision loss. A temporal artery biopsy is necessary to confirm the diagnosis, as it is the definitive test for GCA. The biopsy must be performed soon to avoid false-negative results due to steroid treatment reducing inflammation. Informed consent has been obtained for the biopsy, which involves an incision near the ear to extract a 3 cm segment of the artery. The procedure will be performed under sedation in the operating room. Post-procedure care includes minimal activity restrictions, managing potential swelling with ice, and allowing normal hair washing the next day. Schedule the temporal artery biopsy for tomorrow and continue high-dose steroid treatment until biopsy results are  available.  Polymyalgia Rheumatica   He has a history of polymyalgia rheumatica with previous high-dose steroid treatment and wishes to avoid long-term steroid use due to past side effects such as facial redness. Current management will depend on biopsy results to determine the necessity of continued steroid use. Monitor symptoms and adjust steroid treatment based on biopsy results.   Temporal headache [R51.9]          Patient verbalized understanding, all questions were answered, and were agreeable with the plan outlined above.   Carolan Shiver, MD  Electronically signed by Carolan Shiver, MD

## 2023-08-28 NOTE — H&P (Signed)
 History of Present Illness Russell Snow is a 73 year old male with polymyalgia rheumatica who presents with headaches for evaluation of temporal arteritis. He was referred by Dr. Allena Katz, his rheumatologist, for evaluation of headaches and concern for temporal arteritis.  He has been experiencing headaches localized to the left temporal area for the past two months. The pain is mid-range in intensity, lasting from one to three minutes, and occurs sporadically, sometimes twice or three times a week, and other times only once a week or not at all. The last episode occurred midweek last week. No associated jaw pain, vision loss, nausea, vomiting, or aura preceding the headaches.  He has a history of polymyalgia rheumatica and has previously been on high-dose steroids, which he took for a prolonged period until his sedimentation rates normalized. He started high-dose steroid treatment yesterday morning.  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      PAST MEDICAL HISTORY:  Past Medical History:  Diagnosis Date   Anemia    iron deficiency   Chicken pox    History of diverticulosis    History of prostatitis    Hyperlipidemia    Lumbago    Migraine headache    Seasonal allergies    chronic        PAST SURGICAL HISTORY:   Past Surgical History:  Procedure Laterality Date   Partial colectomy secondary to diverticulosis  1996   EGD  06/08/2006   COLONOSCOPY  08/26/2013   FHCP (Mother/Brother) CBF 08/2018: Recall ltr mailed    INCISION TENDON SHEATH FOR TRIGGER FINGER Left 01/21/2020   THUMB   COLONOSCOPY  02/06/2020   Diverticulosis/Otherwise normal/FHx CP/Repeat 42yrs/JWB   COLONOSCOPY  12/31/2002, 09/08/2008   FHCP (Mother/Brother)    excision morton's neuroma      TONSILLECTOMY           MEDICATIONS:  Outpatient Encounter Medications as of 08/28/2023  Medication Sig Dispense Refill   calcium citrate-vitamin D3 1,000 mg-10 mcg /30 mL Liqd Take by mouth     cyanocobalamin (VITAMIN B12) 1000 MCG tablet Take 1,000 mcg by mouth once daily     predniSONE (DELTASONE) 20 MG tablet Take 3 tablets (60 mg total) by mouth once daily 90 tablet 1   fexofenadine (ALLEGRA) 60 MG tablet Take 60 mg by mouth once daily as needed (Patient not taking: Reported on 08/28/2023)     fluticasone propionate (FLONASE) 50 mcg/actuation nasal spray Place 2 sprays into both nostrils once daily as needed (Patient not taking: Reported on 08/28/2023) 16 g 2   No facility-administered encounter medications on file as of 08/28/2023.     ALLERGIES:   Patient has no known allergies.   SOCIAL HISTORY:  Social History   Socioeconomic History   Marital status: Married  Occupational History   Occupation: Retired  Tobacco Use   Smoking status: Former    Current packs/day: 0.00    Types: Cigarettes    Quit date: 05/22/1976    Years since quitting: 47.2   Smokeless tobacco: Never  Vaping Use   Vaping status: Never Used  Substance and Sexual Activity   Alcohol use: Yes    Alcohol/week: 0.0 standard drinks of alcohol    Comment: occasionally   Drug use: No   Sexual activity: Yes  Partners: Female   Social Drivers of Corporate investment banker Strain: Low Risk  (08/28/2023)   Overall Financial Resource Strain (CARDIA)    Difficulty of Paying Living Expenses: Not hard at all  Food Insecurity: No Food Insecurity (08/28/2023)   Hunger Vital Sign    Worried About Running Out of Food in the Last Year: Never true    Ran Out of Food in the Last Year: Never true  Transportation Needs: No Transportation Needs (08/28/2023)   PRAPARE - Administrator, Civil Service (Medical): No    Lack of Transportation (Non-Medical): No    FAMILY HISTORY:  Family History  Problem Relation  Name Age of Onset   COPD Mother     Myocardial Infarction (Heart attack) Mother     Arthritis Mother     Colon polyps Mother     Diabetes type II Father     High blood pressure (Hypertension) Sister     Diverticulosis Brother     Colon polyps Brother     Fibromyalgia Sister       GENERAL REVIEW OF SYSTEMS:   General ROS: negative for - chills, fatigue, fever, weight gain or weight loss Allergy and Immunology ROS: negative for - hives  Hematological and Lymphatic ROS: negative for - bleeding problems or bruising, negative for palpable nodes Endocrine ROS: negative for - heat or cold intolerance, hair changes Respiratory ROS: negative for - cough, shortness of breath or wheezing Cardiovascular ROS: no chest pain or palpitations GI ROS: negative for nausea, vomiting, abdominal pain, diarrhea, constipation Musculoskeletal ROS: negative for - joint swelling or muscle pain Neurological ROS: negative for - confusion, syncope Dermatological ROS: negative for pruritus and rash  PHYSICAL EXAM:  Vitals:   08/28/23 0723  BP: 130/82  Pulse: 72  .  Ht:172.7 cm (5\' 8" ) Wt:73.5 kg (162 lb) OFB:PZWC surface area is 1.88 meters squared. Body mass index is 24.63 kg/m.Marland Kitchen   GENERAL: Alert, active, oriented x3  HEENT: Pupils equal reactive to light. Extraocular movements are intact. Sclera clear. Palpebral conjunctiva normal red color.Pharynx clear.  Palpable temporal artery.  No tenderness on palpation.  NECK: Supple with no palpable mass and no adenopathy.  LUNGS: Sound clear with no rales rhonchi or wheezes.  HEART: Regular rhythm S1 and S2 without murmur.   EXTREMITIES: Well-developed well-nourished symmetrical with no dependent edema.  NEUROLOGICAL: Awake alert oriented, facial expression symmetrical, moving all extremities.   Results CRP in process Sed rate: 2     Assessment & Plan Giant Cell Arteritis   He presents with left-sided temporal headaches lasting 1-3 minutes,  occurring intermittently over the past two months. The pain is mid-range in severity without jaw pain, vision loss, nausea, vomiting, or aura. A rheumatologist suspects giant cell arteritis (GCA) and has initiated high-dose steroid treatment. The primary concern with GCA is the risk of permanent vision loss. A temporal artery biopsy is necessary to confirm the diagnosis, as it is the definitive test for GCA. The biopsy must be performed soon to avoid false-negative results due to steroid treatment reducing inflammation. Informed consent has been obtained for the biopsy, which involves an incision near the ear to extract a 3 cm segment of the artery. The procedure will be performed under sedation in the operating room. Post-procedure care includes minimal activity restrictions, managing potential swelling with ice, and allowing normal hair washing the next day. Schedule the temporal artery biopsy for tomorrow and continue high-dose steroid treatment until biopsy results are  available.  Polymyalgia Rheumatica   He has a history of polymyalgia rheumatica with previous high-dose steroid treatment and wishes to avoid long-term steroid use due to past side effects such as facial redness. Current management will depend on biopsy results to determine the necessity of continued steroid use. Monitor symptoms and adjust steroid treatment based on biopsy results.   Temporal headache [R51.9]          Patient verbalized understanding, all questions were answered, and were agreeable with the plan outlined above.   Carolan Shiver, MD  Electronically signed by Carolan Shiver, MD

## 2023-08-29 ENCOUNTER — Encounter: Payer: Self-pay | Admitting: General Surgery

## 2023-08-29 ENCOUNTER — Ambulatory Visit
Admission: RE | Admit: 2023-08-29 | Discharge: 2023-08-29 | Disposition: A | Discharge status: Home / Self Care | Attending: General Surgery | Admitting: General Surgery

## 2023-08-29 ENCOUNTER — Other Ambulatory Visit: Payer: Self-pay

## 2023-08-29 ENCOUNTER — Ambulatory Visit

## 2023-08-29 ENCOUNTER — Encounter
Admission: RE | Disposition: A | Discharge status: Home / Self Care | Payer: Self-pay | Source: Home / Self Care | Attending: General Surgery

## 2023-08-29 DIAGNOSIS — K469 Unspecified abdominal hernia without obstruction or gangrene: Secondary | ICD-10-CM

## 2023-08-29 DIAGNOSIS — Z87891 Personal history of nicotine dependence: Secondary | ICD-10-CM | POA: Insufficient documentation

## 2023-08-29 DIAGNOSIS — M353 Polymyalgia rheumatica: Secondary | ICD-10-CM | POA: Insufficient documentation

## 2023-08-29 DIAGNOSIS — R519 Headache, unspecified: Secondary | ICD-10-CM | POA: Insufficient documentation

## 2023-08-29 DIAGNOSIS — I708 Atherosclerosis of other arteries: Secondary | ICD-10-CM | POA: Diagnosis not present

## 2023-08-29 HISTORY — PX: ARTERY BIOPSY: SHX891

## 2023-08-29 SURGERY — BIOPSY TEMPORAL ARTERY
Anesthesia: General | Site: Head | Laterality: Left

## 2023-08-29 MED ORDER — LIDOCAINE HCL (PF) 2 % IJ SOLN
INTRAMUSCULAR | Status: AC
  Filled 2023-08-29: qty 5

## 2023-08-29 MED ORDER — PROPOFOL 1000 MG/100ML IV EMUL
INTRAVENOUS | Status: AC
  Filled 2023-08-29: qty 100

## 2023-08-29 MED ORDER — OXYCODONE HCL 5 MG/5ML PO SOLN: 5.0000 mg | Freq: Once | ORAL | Status: DC | PRN

## 2023-08-29 MED ORDER — FENTANYL CITRATE (PF) 100 MCG/2ML IJ SOLN: 25.0000 ug | INTRAMUSCULAR | Status: DC | PRN

## 2023-08-29 MED ORDER — LIDOCAINE HCL (PF) 1 % IJ SOLN
INTRAMUSCULAR | Status: DC | PRN
  Administered 2023-08-29: 5 mL via SUBCUTANEOUS

## 2023-08-29 MED ORDER — 0.9 % SODIUM CHLORIDE (POUR BTL) OPTIME
TOPICAL | Status: DC | PRN
  Administered 2023-08-29: 20 mL

## 2023-08-29 MED ORDER — CEFAZOLIN SODIUM-DEXTROSE 2-4 GM/100ML-% IV SOLN
INTRAVENOUS | Status: AC
  Filled 2023-08-29: qty 100

## 2023-08-29 MED ORDER — CHLORHEXIDINE GLUCONATE 0.12 % MT SOLN
15.0000 mL | Freq: Once | OROMUCOSAL | Status: AC
  Administered 2023-08-29: 15 mL via OROMUCOSAL

## 2023-08-29 MED ORDER — PROPOFOL 10 MG/ML IV BOLUS
INTRAVENOUS | Status: DC | PRN
  Administered 2023-08-29: 20 mg via INTRAVENOUS

## 2023-08-29 MED ORDER — PROPOFOL 500 MG/50ML IV EMUL
INTRAVENOUS | Status: DC | PRN
  Administered 2023-08-29: 100 ug/kg/min via INTRAVENOUS

## 2023-08-29 MED ORDER — CEFAZOLIN SODIUM-DEXTROSE 2-4 GM/100ML-% IV SOLN
2.0000 g | INTRAVENOUS | Status: AC
  Administered 2023-08-29: 2 g via INTRAVENOUS

## 2023-08-29 MED ORDER — CHLORHEXIDINE GLUCONATE 0.12 % MT SOLN
OROMUCOSAL | Status: AC
  Filled 2023-08-29: qty 15

## 2023-08-29 MED ORDER — HYDROCODONE-ACETAMINOPHEN 5-325 MG PO TABS: 1.0000 | ORAL_TABLET | Freq: Four times a day (QID) | ORAL | 0 refills | Status: AC | PRN

## 2023-08-29 MED ORDER — ONDANSETRON HCL 4 MG/2ML IJ SOLN
INTRAMUSCULAR | Status: DC | PRN
  Administered 2023-08-29: 4 mg via INTRAVENOUS

## 2023-08-29 MED ORDER — ORAL CARE MOUTH RINSE: 15.0000 mL | Freq: Once | OROMUCOSAL | Status: AC

## 2023-08-29 MED ORDER — FENTANYL CITRATE (PF) 100 MCG/2ML IJ SOLN
INTRAMUSCULAR | Status: AC
  Filled 2023-08-29: qty 2

## 2023-08-29 MED ORDER — OXYCODONE HCL 5 MG PO TABS: 5.0000 mg | ORAL_TABLET | Freq: Once | ORAL | Status: DC | PRN

## 2023-08-29 MED ORDER — LIDOCAINE HCL (PF) 1 % IJ SOLN
INTRAMUSCULAR | Status: AC
  Filled 2023-08-29: qty 30

## 2023-08-29 MED ORDER — LACTATED RINGERS IV SOLN: INTRAVENOUS | Status: DC

## 2023-08-29 MED ORDER — KETOROLAC TROMETHAMINE 30 MG/ML IJ SOLN
INTRAMUSCULAR | Status: AC
  Filled 2023-08-29: qty 1

## 2023-08-29 MED ORDER — FENTANYL CITRATE (PF) 100 MCG/2ML IJ SOLN
INTRAMUSCULAR | Status: DC | PRN
  Administered 2023-08-29: 25 ug via INTRAVENOUS

## 2023-08-29 MED ORDER — ONDANSETRON HCL 4 MG/2ML IJ SOLN
INTRAMUSCULAR | Status: AC
  Filled 2023-08-29: qty 2

## 2023-08-29 SURGICAL SUPPLY — 26 items
BLADE CLIPPER SURG (BLADE) ×1 IMPLANT
BLADE SURG SZ11 CARB STEEL (BLADE) ×1 IMPLANT
COTTON BALL STERILE 4 PK (GAUZE/BANDAGES/DRESSINGS) ×1 IMPLANT
DERMABOND ADVANCED .7 DNX12 (GAUZE/BANDAGES/DRESSINGS) ×1 IMPLANT
DRAPE LAPAROTOMY 77X122 PED (DRAPES) ×1 IMPLANT
DRSG TELFA 3X4 N-ADH STERILE (GAUZE/BANDAGES/DRESSINGS) ×1 IMPLANT
ELECT REM PT RETURN 9FT ADLT (ELECTROSURGICAL) ×1 IMPLANT
ELECTRODE REM PT RTRN 9FT ADLT (ELECTROSURGICAL) ×1 IMPLANT
GLOVE BIO SURGEON STRL SZ 6.5 (GLOVE) ×1 IMPLANT
GLOVE BIOGEL PI IND STRL 6.5 (GLOVE) ×1 IMPLANT
GOWN STRL REUS W/ TWL LRG LVL3 (GOWN DISPOSABLE) ×2 IMPLANT
KIT TURNOVER KIT A (KITS) ×1 IMPLANT
LABEL OR SOLS (LABEL) ×1 IMPLANT
MANIFOLD NEPTUNE II (INSTRUMENTS) ×1 IMPLANT
NDL HYPO 25X1 1.5 SAFETY (NEEDLE) IMPLANT
NEEDLE HYPO 25X1 1.5 SAFETY (NEEDLE) ×1 IMPLANT
NS IRRIG 500ML POUR BTL (IV SOLUTION) ×1 IMPLANT
PACK BASIN MINOR ARMC (MISCELLANEOUS) ×1 IMPLANT
SOL PREP PVP 2OZ (MISCELLANEOUS) ×1 IMPLANT
SOLUTION PREP PVP 2OZ (MISCELLANEOUS) ×1 IMPLANT
SUT MNCRL AB 4-0 PS2 18 (SUTURE) ×1 IMPLANT
SUT SILK 3-0 18XBRD TIE 12 (SUTURE) ×1 IMPLANT
SUT VIC AB 3-0 SH 27X BRD (SUTURE) ×1 IMPLANT
SYR 10ML LL (SYRINGE) IMPLANT
TRAP FLUID SMOKE EVACUATOR (MISCELLANEOUS) ×1 IMPLANT
WATER STERILE IRR 500ML POUR (IV SOLUTION) ×1 IMPLANT

## 2023-08-29 NOTE — Interval H&P Note (Signed)
 History and Physical Interval Note:  08/29/2023 10:12 AM  Russell Snow  has presented today for surgery, with the diagnosis of R51.9 temporal headache.  The various methods of treatment have been discussed with the patient and family. After consideration of risks, benefits and other options for treatment, the patient has consented to  Procedure(s): BIOPSY TEMPORAL ARTERY (Left) as a surgical intervention.  The patient's history has been reviewed, patient examined, no change in status, stable for surgery.  I have reviewed the patient's chart and labs.  Questions were answered to the patient's satisfaction.     Carolan Shiver

## 2023-08-29 NOTE — Anesthesia Procedure Notes (Signed)
 Procedure Name: MAC Date/Time: 08/29/2023 12:55 PM  Performed by: Cheral Bay, CRNAPre-anesthesia Checklist: Patient identified, Emergency Drugs available, Suction available, Patient being monitored and Timeout performed Patient Re-evaluated:Patient Re-evaluated prior to induction Oxygen Delivery Method: Nasal cannula Induction Type: IV induction Placement Confirmation: positive ETCO2 and CO2 detector

## 2023-08-29 NOTE — Discharge Instructions (Addendum)
  Diet: Resume home heart healthy regular diet.   Activity: Increase activity as tolerated.   Wound care: May shower with soapy water and pat dry (do not rub incisions), but no baths or submerging incision underwater until follow-up. (no swimming)   Medications: Resume all home medications. For mild to moderate pain: acetaminophen (Tylenol) or ibuprofen (if no kidney disease). Combining Tylenol with alcohol can substantially increase your risk of causing liver disease. Narcotic pain medications, if prescribed, can be used for severe pain, though may cause nausea, constipation, and drowsiness. Do not combine Tylenol and Norco within a 6 hour period as Norco contains Tylenol. If you do not need the narcotic pain medication, you do not need to fill the prescription.  Call office [phone]) at any time if any questions, worsening pain, fevers/chills, bleeding, drainage from incision site, or other concerns.

## 2023-08-29 NOTE — Op Note (Signed)
 Pre Op Diagnosis: Left temporal headaches  Post Op Diagnosis: Same  Procedure: Left Temporal artery biopsy  Anesthesia: MAC and local   Surgeon: Dr. Hazle Quant  Indications: This 73 y.o. year old male has new-onset headache with left temporal artery tenderness to palpation. The patient has been informed that a temporal artery biopsy is required to confirm the diagnosis of temporal arteritis. The risks of the procedure were explained to the patient who elected to proceed with the biopsy.   Description of procedure: The patient was placed in the supine position with the head turned so the operative side is up. A time-out was completed verifying correct patient, procedure, site, positioning, and implant(s) and/or special equipment prior to beginning this procedure. The left temporal area was prepped and draped in the usual sterile fashion. Local anesthetics (1% Lidocaine) without epinephrine to minimize arterial spasm were injected. A 3.5 cm incision was made directly over the artery through the skin and subcutaneous tissue using a #15 blade scalpel. The temporal artery was identified in the superficial layers of the superficial temporal fascia. Blunt dissection with a hemostat was performed parallel to the vessel to avoid tearing it. The dissection proceeded beneath the vessel so that a hemostat was passed below it. After the vessel was isolated, 4-0 silk sutures were passed around the proximal and distal portions of the isolated artery and tied. Branches of the main artery were also ligated. The vessel was then transected. In vivo, it measured 3 cm. After ensuring hemostasis, the subcutaneous tissues were closed using interrupted 4-0 Vicryl sutures. The skin was closed with a running subcuticular 4-0 Monocryl sutures. Dermabond applied over the wound and covered with sterile dressings.  The patient tolerated well the procedure.  The specimen was sent to the pathologist.   Specimen: Left Temporal  Artery  Carolan Shiver, MD, FACS

## 2023-08-29 NOTE — Transfer of Care (Signed)
 Immediate Anesthesia Transfer of Care Note  Patient: Russell Snow  Procedure(s) Performed: BIOPSY TEMPORAL ARTERY (Left: Head)  Patient Location: PACU  Anesthesia Type:General  Level of Consciousness: awake  Airway & Oxygen Therapy: Patient Spontanous Breathing  Post-op Assessment: Report given to RN and Post -op Vital signs reviewed and stable  Post vital signs: Reviewed and stable  Last Vitals:  Vitals Value Taken Time  BP 103/77 08/29/23 1330  Temp    Pulse 60 08/29/23 1331  Resp 11 08/29/23 1331  SpO2 97 % 08/29/23 1331  Vitals shown include unfiled device data.  Last Pain:  Vitals:   08/29/23 0952  TempSrc: Temporal  PainSc: 0-No pain         Complications: There were no known notable events for this encounter.

## 2023-08-29 NOTE — Anesthesia Preprocedure Evaluation (Signed)
 Anesthesia Evaluation  Patient identified by MRN, date of birth, ID band Patient awake    Reviewed: Allergy & Precautions, H&P , NPO status , Patient's Chart, lab work & pertinent test results  History of Anesthesia Complications Negative for: history of anesthetic complications  Airway Mallampati: III      Comment: TM 3 FB Dental  (+) Teeth Intact   Pulmonary neg sleep apnea, neg COPD, former smoker   breath sounds clear to auscultation       Cardiovascular (-) angina (-) Past MI and (-) Cardiac Stents negative cardio ROS (-) dysrhythmias  Rhythm:regular Rate:Normal     Neuro/Psych  Headaches  negative psych ROS   GI/Hepatic negative GI ROS, Neg liver ROS,,,  Endo/Other  negative endocrine ROS    Renal/GU negative Renal ROS  negative genitourinary   Musculoskeletal   Abdominal   Peds  Hematology negative hematology ROS (+)   Anesthesia Other Findings Past Medical History: No date: Anemia     Comment:  iron deficiency No date: Diverticulosis No date: Headache No date: Hyperlipidemia No date: Lumbago No date: Prostatitis  Past Surgical History: 1996: COLECTOMY 07/2013: COLONOSCOPY     Comment:  Dr. Mechele Collin 1998?: FOOT SURGERY     Comment:  tumor removed 11-11-13: HERNIA REPAIR; Left     Comment:  inguinal 1957: TONSILLECTOMY  BMI    Body Mass Index: 24.33 kg/m      Reproductive/Obstetrics negative OB ROS                             Anesthesia Physical Anesthesia Plan  ASA: 2  Anesthesia Plan: General   Post-op Pain Management:    Induction: Intravenous  PONV Risk Score and Plan: 2 and Propofol infusion, TIVA, Ondansetron and Midazolam  Airway Management Planned: LMA  Additional Equipment:   Intra-op Plan:   Post-operative Plan: Extubation in OR  Informed Consent: I have reviewed the patients History and Physical, chart, labs and discussed the procedure  including the risks, benefits and alternatives for the proposed anesthesia with the patient or authorized representative who has indicated his/her understanding and acceptance.     Dental Advisory Given  Plan Discussed with: Anesthesiologist, CRNA and Surgeon  Anesthesia Plan Comments: (Patient consented for risks of anesthesia including but not limited to:  - adverse reactions to medications - damage to eyes, teeth, lips or other oral mucosa - nerve damage due to positioning  - sore throat or hoarseness - Damage to heart, brain, nerves, lungs, other parts of body or loss of life  Patient voiced understanding and assent.)        Anesthesia Quick Evaluation

## 2023-08-30 ENCOUNTER — Encounter: Payer: Self-pay | Admitting: General Surgery

## 2023-08-30 LAB — SURGICAL PATHOLOGY

## 2023-08-30 NOTE — Anesthesia Postprocedure Evaluation (Signed)
 Anesthesia Post Note  Patient: NICHOLS CORTER  Procedure(s) Performed: BIOPSY TEMPORAL ARTERY (Left: Head)  Patient location during evaluation: PACU Anesthesia Type: General Level of consciousness: awake and alert Pain management: pain level controlled Vital Signs Assessment: post-procedure vital signs reviewed and stable Respiratory status: spontaneous breathing, nonlabored ventilation, respiratory function stable and patient connected to nasal cannula oxygen Cardiovascular status: blood pressure returned to baseline and stable Postop Assessment: no apparent nausea or vomiting Anesthetic complications: no  There were no known notable events for this encounter.   Last Vitals:  Vitals:   08/29/23 1400 08/29/23 1408  BP: 113/76 124/81  Pulse: (!) 53 63  Resp: 12 16  Temp:  37.1 C  SpO2: 99% 98%    Last Pain:  Vitals:   08/30/23 0833  TempSrc:   PainSc: 0-No pain                 Stephanie Coup

## 2023-09-20 ENCOUNTER — Other Ambulatory Visit: Payer: Self-pay | Admitting: Student

## 2023-09-20 DIAGNOSIS — R519 Headache, unspecified: Secondary | ICD-10-CM

## 2023-09-22 ENCOUNTER — Ambulatory Visit
Admission: RE | Admit: 2023-09-22 | Discharge: 2023-09-22 | Disposition: A | Discharge status: Home / Self Care | Source: Ambulatory Visit | Attending: Student | Admitting: Student

## 2023-09-22 DIAGNOSIS — R519 Headache, unspecified: Secondary | ICD-10-CM | POA: Insufficient documentation
